# Patient Record
Sex: Male | Born: 1935 | Race: Black or African American | Hispanic: No | Marital: Single | Smoking: Former smoker
Health system: Southern US, Community
[De-identification: ages and names within clinical notes are randomized; demographics above are authoritative.]

## PROBLEM LIST (undated history)

## (undated) DIAGNOSIS — I10 Essential (primary) hypertension: Secondary | ICD-10-CM

## (undated) DIAGNOSIS — N400 Enlarged prostate without lower urinary tract symptoms: Secondary | ICD-10-CM

## (undated) DIAGNOSIS — I639 Cerebral infarction, unspecified: Secondary | ICD-10-CM

## (undated) DIAGNOSIS — C801 Malignant (primary) neoplasm, unspecified: Secondary | ICD-10-CM

---

## 2010-10-27 ENCOUNTER — Emergency Department (HOSPITAL_COMMUNITY): Payer: Medicare Other

## 2010-10-27 ENCOUNTER — Emergency Department (HOSPITAL_COMMUNITY)
Admission: EM | Admit: 2010-10-27 | Discharge: 2010-10-27 | Disposition: A | Discharge status: Home / Self Care | Payer: Medicare Other | Attending: Emergency Medicine | Admitting: Emergency Medicine

## 2010-10-27 DIAGNOSIS — S7000XA Contusion of unspecified hip, initial encounter: Secondary | ICD-10-CM | POA: Insufficient documentation

## 2010-10-27 DIAGNOSIS — S20219A Contusion of unspecified front wall of thorax, initial encounter: Secondary | ICD-10-CM | POA: Insufficient documentation

## 2010-10-27 DIAGNOSIS — Z7901 Long term (current) use of anticoagulants: Secondary | ICD-10-CM | POA: Insufficient documentation

## 2010-10-27 DIAGNOSIS — S40019A Contusion of unspecified shoulder, initial encounter: Secondary | ICD-10-CM | POA: Insufficient documentation

## 2010-10-27 DIAGNOSIS — I10 Essential (primary) hypertension: Secondary | ICD-10-CM | POA: Insufficient documentation

## 2010-10-27 DIAGNOSIS — W1789XA Other fall from one level to another, initial encounter: Secondary | ICD-10-CM | POA: Insufficient documentation

## 2010-10-27 DIAGNOSIS — Z79899 Other long term (current) drug therapy: Secondary | ICD-10-CM | POA: Insufficient documentation

## 2018-04-18 ENCOUNTER — Ambulatory Visit (INDEPENDENT_AMBULATORY_CARE_PROVIDER_SITE_OTHER): Payer: Medicare Other | Admitting: Urology

## 2018-04-18 DIAGNOSIS — C61 Malignant neoplasm of prostate: Secondary | ICD-10-CM

## 2018-04-18 DIAGNOSIS — N400 Enlarged prostate without lower urinary tract symptoms: Secondary | ICD-10-CM | POA: Diagnosis not present

## 2018-04-18 DIAGNOSIS — R310 Gross hematuria: Secondary | ICD-10-CM | POA: Diagnosis not present

## 2018-05-09 ENCOUNTER — Ambulatory Visit (INDEPENDENT_AMBULATORY_CARE_PROVIDER_SITE_OTHER): Payer: Medicare Other | Admitting: Urology

## 2018-05-09 DIAGNOSIS — R310 Gross hematuria: Secondary | ICD-10-CM | POA: Diagnosis not present

## 2018-05-09 DIAGNOSIS — C61 Malignant neoplasm of prostate: Secondary | ICD-10-CM | POA: Diagnosis not present

## 2018-05-10 ENCOUNTER — Other Ambulatory Visit: Payer: Self-pay | Admitting: Urology

## 2018-05-10 DIAGNOSIS — R310 Gross hematuria: Secondary | ICD-10-CM

## 2018-05-10 DIAGNOSIS — C61 Malignant neoplasm of prostate: Secondary | ICD-10-CM

## 2018-05-26 ENCOUNTER — Other Ambulatory Visit: Payer: Self-pay | Admitting: Urology

## 2018-05-26 DIAGNOSIS — C61 Malignant neoplasm of prostate: Secondary | ICD-10-CM

## 2018-06-05 ENCOUNTER — Emergency Department (HOSPITAL_COMMUNITY)
Admission: EM | Admit: 2018-06-05 | Discharge: 2018-06-05 | Disposition: A | Discharge status: Home / Self Care | Payer: Medicare Other | Attending: Emergency Medicine | Admitting: Emergency Medicine

## 2018-06-05 ENCOUNTER — Emergency Department (HOSPITAL_COMMUNITY): Payer: Medicare Other

## 2018-06-05 ENCOUNTER — Encounter (HOSPITAL_COMMUNITY): Payer: Self-pay | Admitting: Emergency Medicine

## 2018-06-05 ENCOUNTER — Other Ambulatory Visit: Payer: Self-pay

## 2018-06-05 DIAGNOSIS — I10 Essential (primary) hypertension: Secondary | ICD-10-CM | POA: Diagnosis not present

## 2018-06-05 DIAGNOSIS — Z79899 Other long term (current) drug therapy: Secondary | ICD-10-CM | POA: Diagnosis not present

## 2018-06-05 DIAGNOSIS — N201 Calculus of ureter: Secondary | ICD-10-CM

## 2018-06-05 DIAGNOSIS — M545 Low back pain: Secondary | ICD-10-CM | POA: Insufficient documentation

## 2018-06-05 DIAGNOSIS — G20 Parkinson's disease: Secondary | ICD-10-CM | POA: Diagnosis not present

## 2018-06-05 DIAGNOSIS — R1084 Generalized abdominal pain: Secondary | ICD-10-CM | POA: Diagnosis present

## 2018-06-05 DIAGNOSIS — R310 Gross hematuria: Secondary | ICD-10-CM

## 2018-06-05 DIAGNOSIS — C61 Malignant neoplasm of prostate: Secondary | ICD-10-CM

## 2018-06-05 DIAGNOSIS — W19XXXA Unspecified fall, initial encounter: Secondary | ICD-10-CM

## 2018-06-05 HISTORY — DX: Benign prostatic hyperplasia without lower urinary tract symptoms: N40.0

## 2018-06-05 HISTORY — DX: Malignant (primary) neoplasm, unspecified: C80.1

## 2018-06-05 HISTORY — DX: Cerebral infarction, unspecified: I63.9

## 2018-06-05 HISTORY — DX: Essential (primary) hypertension: I10

## 2018-06-05 LAB — CBC
HCT: 35 % — ABNORMAL LOW (ref 39.0–52.0)
HEMOGLOBIN: 10.8 g/dL — AB (ref 13.0–17.0)
MCH: 28.3 pg (ref 26.0–34.0)
MCHC: 30.9 g/dL (ref 30.0–36.0)
MCV: 91.6 fL (ref 80.0–100.0)
PLATELETS: 228 10*3/uL (ref 150–400)
RBC: 3.82 MIL/uL — AB (ref 4.22–5.81)
RDW: 14.5 % (ref 11.5–15.5)
WBC: 4 10*3/uL (ref 4.0–10.5)
nRBC: 0 % (ref 0.0–0.2)

## 2018-06-05 LAB — BASIC METABOLIC PANEL
Anion gap: 6 (ref 5–15)
BUN: 18 mg/dL (ref 8–23)
CHLORIDE: 103 mmol/L (ref 98–111)
CO2: 25 mmol/L (ref 22–32)
CREATININE: 1.31 mg/dL — AB (ref 0.61–1.24)
Calcium: 8.9 mg/dL (ref 8.9–10.3)
GFR, EST AFRICAN AMERICAN: 59 mL/min — AB (ref >60)
GFR, EST NON AFRICAN AMERICAN: 51 mL/min — AB (ref >60)
Glucose, Bld: 100 mg/dL — ABNORMAL HIGH (ref 70–99)
POTASSIUM: 3.4 mmol/L — AB (ref 3.5–5.1)
Sodium: 134 mmol/L — ABNORMAL LOW (ref 135–145)

## 2018-06-05 MED ORDER — IOPAMIDOL (ISOVUE-300) INJECTION 61%
100.0000 mL | Freq: Once | INTRAVENOUS | Status: AC | PRN
  Administered 2018-06-05: 100 mL via INTRAVENOUS

## 2018-06-05 MED ORDER — MORPHINE SULFATE (PF) 2 MG/ML IV SOLN
2.0000 mg | Freq: Once | INTRAVENOUS | Status: AC
Start: 2018-06-05 — End: 2018-06-05
  Administered 2018-06-05: 2 mg via INTRAVENOUS
  Filled 2018-06-05: qty 1

## 2018-06-05 NOTE — ED Provider Notes (Signed)
[facility] EMERGENCY DEPARTMENT Provider Note   CSN: [id number] Arrival date & time: 06/05/18  1603     History   Chief Complaint Chief Complaint  Patient presents with  . Fall    HPI Yao Hyppolite is a 82 y.o. male.  HPI Pt states his feet got caught this morning when he attempted to walk.Marland Kitchen   He ended up falling on the floor.  Patient has a history of Parkinson's and difficulty with his gait . pt is now having pain in his waist area and his lower back .  Patient states the pain in his lower abdomen has become severe.  He feels like he is bleeding internally.  No headache.  No cp or shob.  Patient denies any pain in his extremities.  Patient is scheduled for a nuclear medicine bone scan of his body tomorrow. Past Medical History:  Diagnosis Date  . Cancer [facility])    prostate  . Enlarged prostate   . Hypertension   . Stroke [facility])    TIA    There are no active problems to display for this patient.   History reviewed. No pertinent surgical history.      Home Medications    Prior to Admission medications   Medication Sig Start Date End Date Taking? Authorizing Provider  Cholecalciferol (VITAMIN D3) 25 MCG (1000 UT) CHEW Chew 1 tablet by mouth daily.   Yes [provider]  clotrimazole-betamethasone (LOTRISONE) cream Apply 1 application topically daily as needed. 04/15/18  Yes [provider]  docusate sodium (COLACE) 100 MG capsule Take 100 mg by mouth daily as needed for mild constipation.   Yes [provider]  finasteride (PROSCAR) 5 MG tablet Take 5 mg by mouth daily. 04/18/18  Yes [provider]  furosemide (LASIX) 20 MG tablet Take 20 mg by mouth.   Yes [provider]  levothyroxine (SYNTHROID, LEVOTHROID) 137 MCG tablet Take 137 mcg by mouth daily before breakfast.   Yes [provider]  losartan-hydrochlorothiazide (HYZAAR) 50-12.5 MG tablet Take 1 tablet by mouth daily.   Yes [provider]    metoprolol succinate (TOPROL-XL) 25 MG 24 hr tablet Take 25 mg by mouth daily.   Yes [provider]  Multiple Vitamins-Minerals (CENTRUM SILVER) CHEW Chew 1 tablet by mouth daily.   Yes [provider]  traMADol (ULTRAM) 50 MG tablet Take 50 mg by mouth 2 (two) times daily as needed. Take 1 tablet by mouth twice daily as needed for severe pain. 03/11/18  Yes [provider]  warfarin (COUMADIN) 2.5 MG tablet Take 2.5 mg by mouth daily.   Yes [provider]    Family History No family history on file.  Social History Social History   Tobacco Use  . Smoking status: Former Research scientist (life sciences)  . Smokeless tobacco: Former Network engineer Use Topics  . Alcohol use: Not Currently  . Drug use: Not Currently     Allergies   Patient has no known allergies.   Review of Systems Review of Systems  All other systems reviewed and are negative.    Physical Exam Updated Vital Signs BP (!) 152/88   Pulse 96   Temp 99.1 F (37.3 C) (Oral)   Resp 20   Ht 1.854 m (6\' 1" )   Wt 90.7 kg   SpO2 96%   BMI 26.39 kg/m   Physical Exam Vitals signs and nursing note reviewed.  Constitutional:      General: He is not in acute  distress.    Appearance: He is well-developed.  HENT:     Head: Normocephalic and atraumatic.     Right Ear: External ear normal.     Left Ear: External ear normal.  Eyes:     General: No scleral icterus.       Right eye: No discharge.        Left eye: No discharge.     Conjunctiva/sclera: Conjunctivae normal.  Neck:     Musculoskeletal: Neck supple.     Trachea: No tracheal deviation.  Cardiovascular:     Rate and Rhythm: Normal rate and regular rhythm.  Pulmonary:     Effort: Pulmonary effort is normal. No respiratory distress.     Breath sounds: Normal breath sounds. No stridor. No wheezing or rales.  Abdominal:     General: Bowel sounds are normal. There is no distension.     Palpations: Abdomen is soft. There is no pulsatile  mass.     Tenderness: There is abdominal tenderness in the suprapubic area. There is no guarding or rebound.  Musculoskeletal:     Right hip: Normal.     Left hip: Normal.     Cervical back: Normal.     Thoracic back: Normal.     Lumbar back: He exhibits tenderness and bony tenderness.  Skin:    General: Skin is warm and dry.     Findings: No rash.  Neurological:     General: No focal deficit present.     Mental Status: He is alert.     Cranial Nerves: No cranial nerve deficit (no facial droop, extraocular movements intact, no slurred speech).     Sensory: No sensory deficit.     Motor: No abnormal muscle tone or seizure activity.     Coordination: Coordination abnormal. Impaired rapid alternating movements.     Comments: Movements are slow      ED Treatments / Results  Labs (all labs ordered are listed, but only abnormal results are displayed) Labs Reviewed  CBC - Abnormal; Notable for the following components:      Result Value   RBC 3.82 (*)    Hemoglobin 10.8 (*)    HCT 35.0 (*)    All other components within normal limits  BASIC METABOLIC PANEL - Abnormal; Notable for the following components:   Sodium 134 (*)    Potassium 3.4 (*)    Glucose, Bld 100 (*)    Creatinine, Ser 1.31 (*)    GFR calc non Af Amer 51 (*)    GFR calc Af Amer 59 (*)    All other components within normal limits    EKG None  Radiology Dg Lumbar Spine Complete  Result Date: 06/05/2018 CLINICAL DATA:  Low back pain after falling today EXAM: LUMBAR SPINE - COMPLETE 4+ VIEW COMPARISON:  None FINDINGS: Osseous demineralization. Five non-rib-bearing lumbar vertebra. Superior endplate compression fractures are identified at L1 and L3, with approximately 33% height loss at L1 and 20% height loss at L3, both age indeterminate in character. No subluxation or bone destruction. Multilevel mild disc space narrowing. SI joints preserved. Facet degenerative changes lower lumbar spine and at lumbosacral  junction. IMPRESSION: Age-indeterminate superior endplate compression fractures of L1 and L3. Scattered degenerative disc and facet disease changes of the lumbar spine with osseous demineralization. Electronically Signed   By: Lavonia Dana M.D.   On: 06/05/2018 17:26   Ct Abdomen Pelvis W Contrast  Result Date: 06/05/2018 CLINICAL DATA:  Low back pain following a  fall this morning. EXAM: CT ABDOMEN AND PELVIS WITH CONTRAST TECHNIQUE: Multidetector CT imaging of the abdomen and pelvis was performed using the standard protocol following bolus administration of intravenous contrast. CONTRAST:  176mL ISOVUE-300 IOPAMIDOL (ISOVUE-300) INJECTION 61% COMPARISON:  Lumbar spine radiographs obtained earlier today. FINDINGS: Lower chest: Enlarged heart with marked biatrial enlargement, most pronounced involving the left atrium. Hepatobiliary: Poorly delineated due to streak artifacts. Multiple small liver cysts. The gallbladder is not at well visualized. Pancreas: No gross abnormality. Spleen: Grossly normal. Adrenals/Urinary Tract: Normal appearing adrenal glands. Large number of cysts in both kidneys. These include a 6.1 cm mid left renal cyst with minimally thickened wall calcifications. No visible soft tissue component. Multiple small calculi in the mid and lower left kidney, measuring up to 4 mm in maximum diameter each. Moderate dilatation of the left renal collecting system to the level of a 7 mm calculus in the proximal ureter to just distal to the ureteropelvic junction. No additional ureteral calculi are seen. Normal appearing urinary bladder. Stomach/Bowel: Multiple sigmoid and descending colon diverticula without evidence diverticulitis. Normal appearing stomach, small bowel and appendix. Vascular/Lymphatic: Atheromatous arterial calcifications without aneurysm. No enlarged lymph nodes. Reproductive: Mildly to moderately enlarged prostate gland containing coarse calcifications. Moderate enlargement of the  seminal vesicles. Other: Small umbilical hernia containing fat. There is edema within the herniated fat as well as diffuse subcutaneous edema. Musculoskeletal: Lumbar and lower thoracic spine degenerative changes. Approximately 25% L1 and L3 vertebral superior endplate compression deformities no acute fracture lines seen. No bony retropulsion at the L1 level. There is mild bony retropulsion and spur formation at the L3 level. Patchy sclerotic foci are demonstrated in the S1 and S2 vertebrae with small sclerotic foci in the sacral ala bilateral and left iliac bone. IMPRESSION: 1. 7 mm proximal left ureteral calculus causing moderate left hydronephrosis. 2. Multiple small, nonobstructing left renal calculi. 3. Large number of cysts in both kidneys, including a 6.1 cm mid left renal cyst with minimally thickened wall calcifications. This is a Bosniak category 2 cyst. 4. Colonic diverticulosis. 5. Mildly to moderately enlarged prostate gland. 6. Small umbilical hernia containing fat. 7. Patchy sclerotic foci in the sacrum and left iliac bone. These are concerning for possible prostate cancer metastases Electronically Signed   By: Claudie Revering M.D.   On: 06/05/2018 20:22    Procedures Procedures (including critical care time)  Medications Ordered in ED Medications  morphine 2 MG/ML injection 2 mg (2 mg Intravenous Given 06/05/18 1657)  iopamidol (ISOVUE-300) 61 % injection 100 mL (100 mLs Intravenous Contrast Given 06/05/18 1928)     Initial Impression / Assessment and Plan / ED Course  I have reviewed the triage vital signs and the nursing notes.  Pertinent labs & imaging results that were available during my care of the patient were reviewed by me and considered in my medical decision making (see chart for details).   Patient's hemoglobin and creatinine are at his baseline.  Patient had old labs with him to compare.  X-ray findings show the possibility of lumbar compression fractures.  I suspect these  are most likely chronic as the patient does not have any discrete bony tenderness in those areas.  It is possibly could be related to his fall but he is not having any severe discomfort and this can be treated conservatively.  CT scan does show evidence of ureteral stone and hydronephrosis.  There is also possible sclerosis in the left iliac bone.  Patient is seeing Dr.  Dahlstedt, urology for hematuria.  He is scheduled for a bone scan tomorrow.  Patient is already in the process of getting these findings evaluated.  He is stable for outpatient discharge and treatment    Final Clinical Impressions(s) / ED Diagnoses   Final diagnoses:  Fall, initial encounter  Ureteral stone    ED Discharge Orders    None       Dorie Rank, MD 06/05/18 2042

## 2018-06-05 NOTE — Discharge Instructions (Addendum)
Follow-up tomorrow for the bone scan as planned, follow-up with Dr. Diona Fanti regarding the ureteral stone, take over the counter medications to help with your pain, return as needed for worsening symptoms

## 2018-06-05 NOTE — ED Triage Notes (Signed)
Pt with hx of Parkinson's had a fall around 1130 this morning. Pt lost his balance and fell. Pt c/o lower back pain. Pt denies hitting head or LOC.

## 2018-06-06 ENCOUNTER — Encounter (HOSPITAL_COMMUNITY)
Admission: RE | Admit: 2018-06-06 | Discharge: 2018-06-06 | Disposition: A | Discharge status: Home / Self Care | Payer: Medicare Other | Source: Ambulatory Visit | Attending: Urology | Admitting: Urology

## 2018-06-06 ENCOUNTER — Ambulatory Visit (HOSPITAL_COMMUNITY)
Admission: RE | Admit: 2018-06-06 | Discharge: 2018-06-06 | Disposition: A | Discharge status: Home / Self Care | Payer: Medicare Other | Source: Ambulatory Visit | Attending: Urology | Admitting: Urology

## 2018-06-06 ENCOUNTER — Encounter (HOSPITAL_COMMUNITY): Payer: Self-pay

## 2018-06-06 DIAGNOSIS — C61 Malignant neoplasm of prostate: Secondary | ICD-10-CM

## 2018-06-06 MED ORDER — TECHNETIUM TC 99M MEDRONATE IV KIT
20.0000 | PACK | Freq: Once | INTRAVENOUS | Status: AC | PRN
  Administered 2018-06-06: 21 via INTRAVENOUS

## 2018-07-25 ENCOUNTER — Ambulatory Visit: Payer: Medicare Other | Admitting: Urology

## 2019-10-27 IMAGING — CT CT ABD-PELV W/ CM
2 of 6 series · 15 of 46 positions shown, 17 images · IV contrast (Isovue)
Comparison: Lumbar spine radiographs obtained earlier today.

CLINICAL DATA: Low back pain following a fall this morning.

EXAM:
CT ABDOMEN AND PELVIS WITH CONTRAST
TECHNIQUE: Multidetector CT imaging of the abdomen and pelvis was performed
using the standard protocol following bolus administration of
intravenous contrast.
CONTRAST:  100mL D2KPZ5-499 IOPAMIDOL (D2KPZ5-499) INJECTION 61%

[Series 3: axial st · axial · 0.70mm/px · z∈[-404,-14]mm · 12 of 92 slices shown, 14 images]
[im 7/92  soft-tissue]
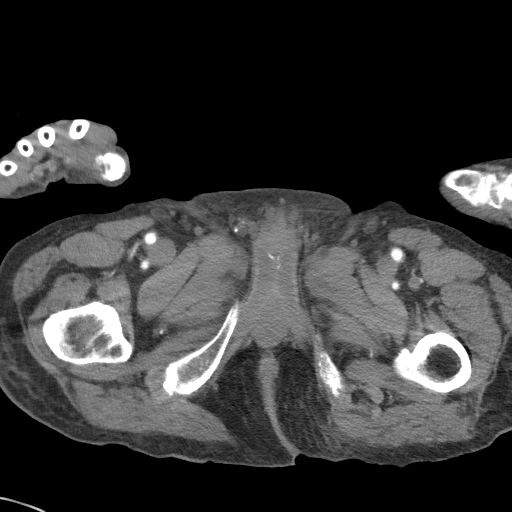
[im 7/92  bone]
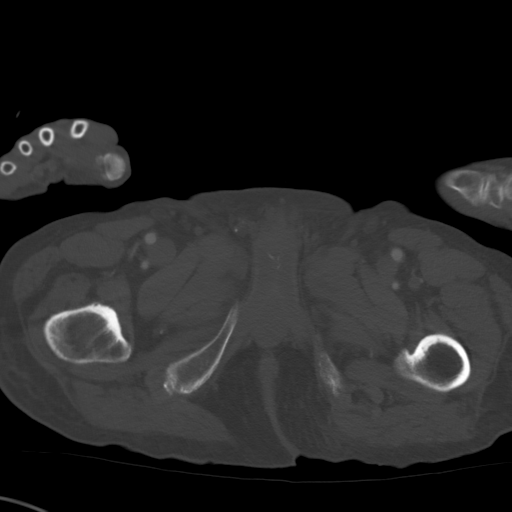
[im 13/92  soft-tissue]
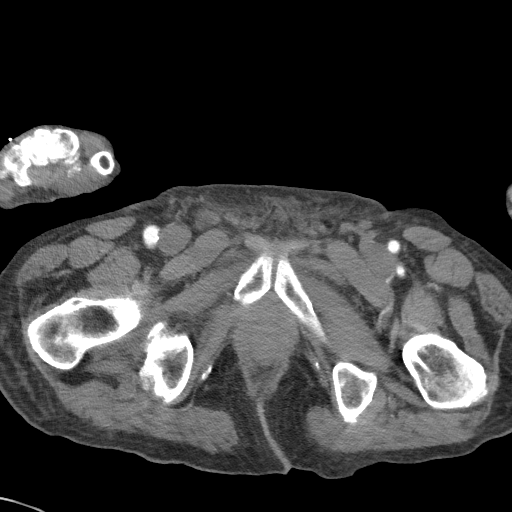
[im 19/92  soft-tissue]
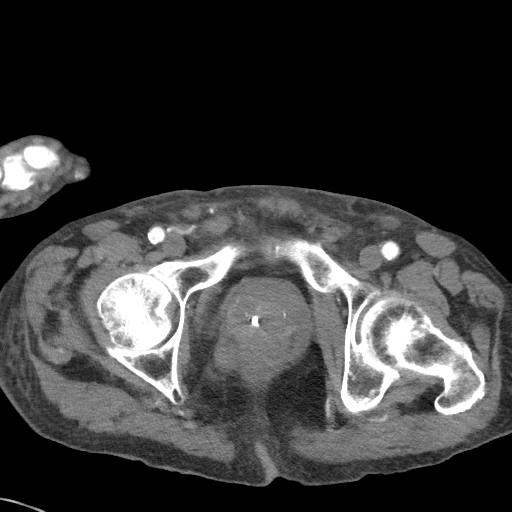
[im 31/92  soft-tissue]
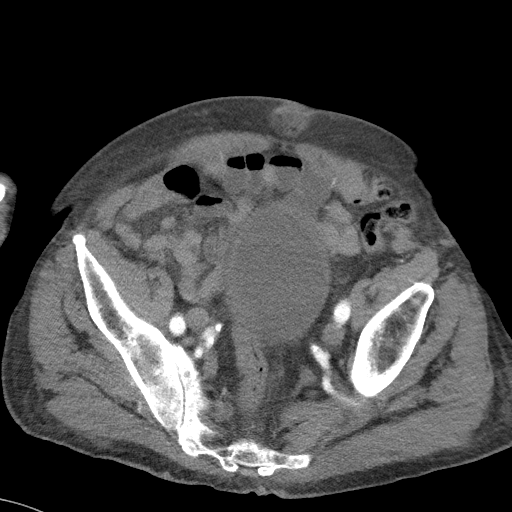
[im 37/92  soft-tissue]
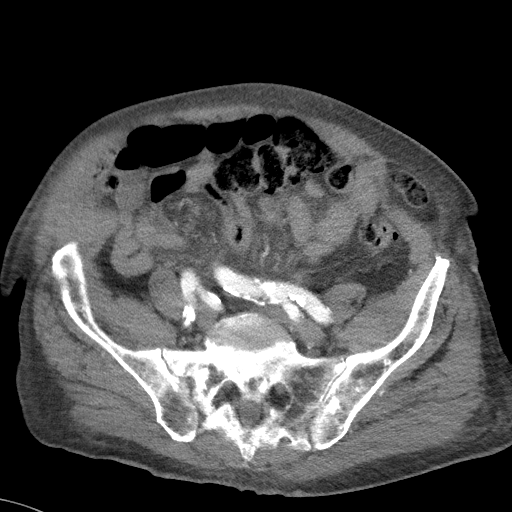
[im 43/92  soft-tissue]
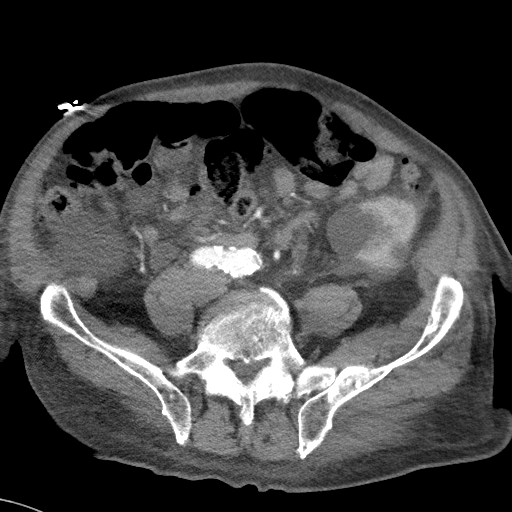
[im 49/92  soft-tissue]
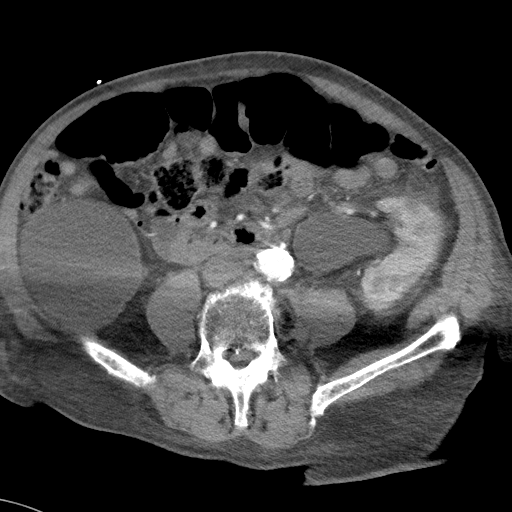
[im 55/92  soft-tissue]
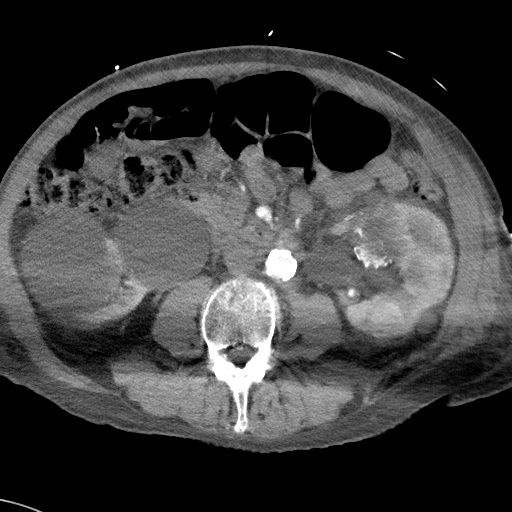
[im 61/92  soft-tissue]
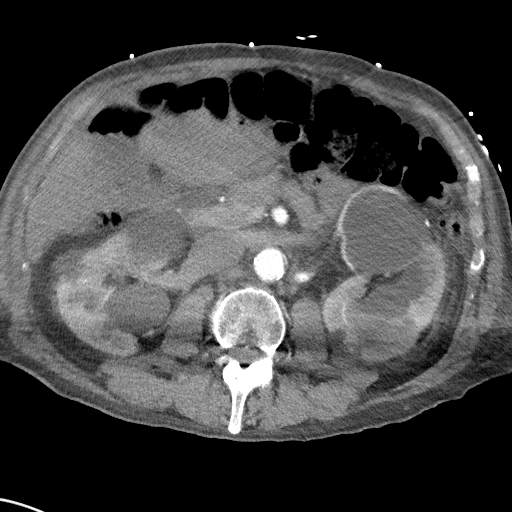
[im 61/92  bone]
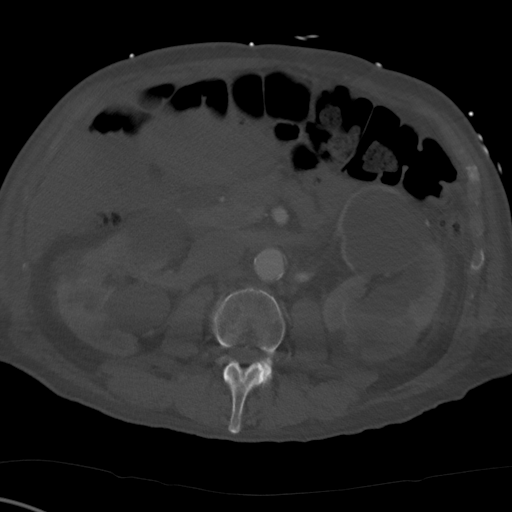
[im 73/92  soft-tissue]
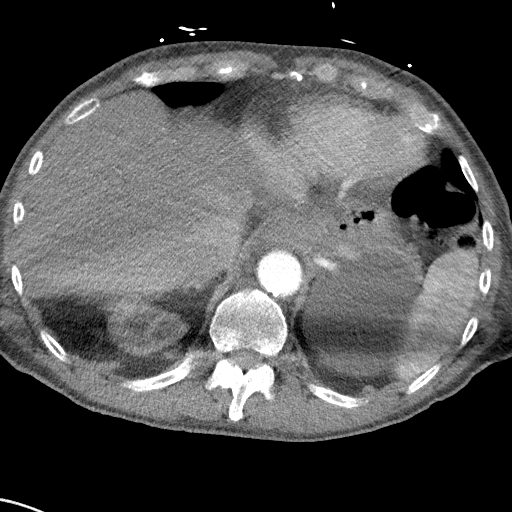
[im 79/92  soft-tissue]
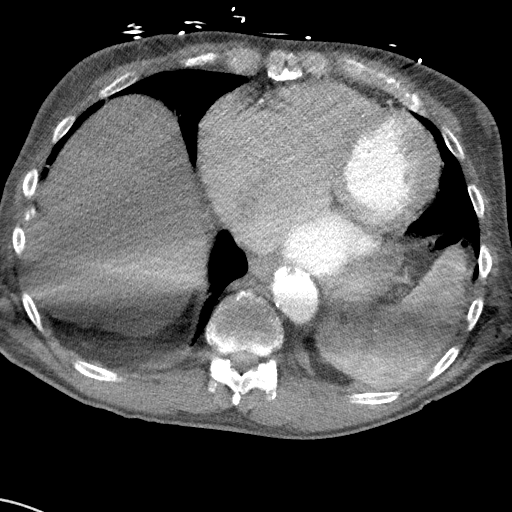
[im 85/92  soft-tissue]
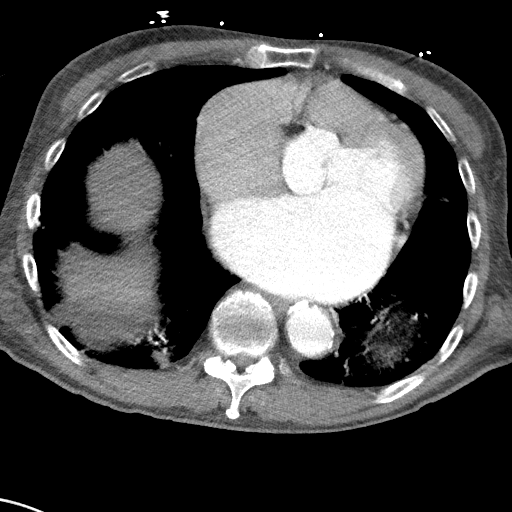

[Series 6: coronal st · coronal · 0.74mm/px · 3 of 100 slices shown]
[im 34/100  soft-tissue]
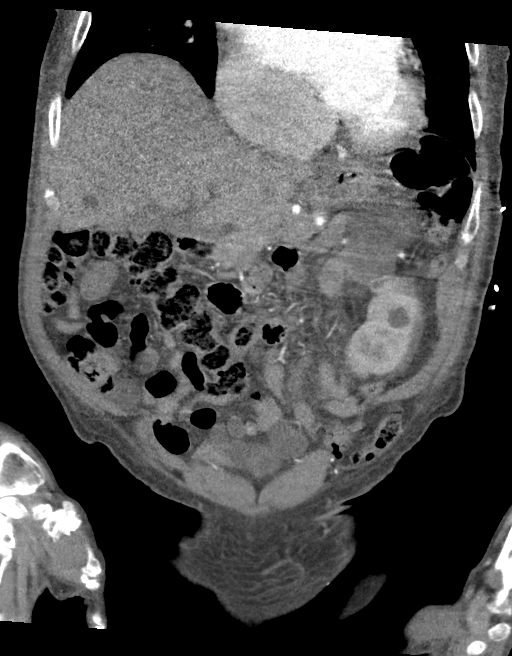
[im 45/100  soft-tissue]
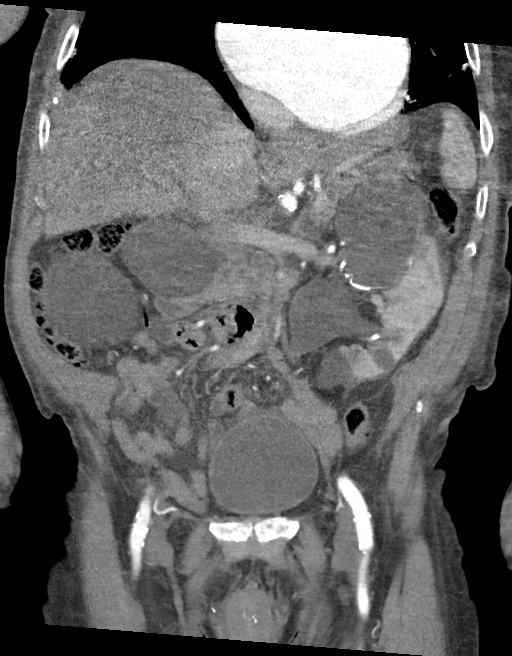
[im 56/100  soft-tissue]
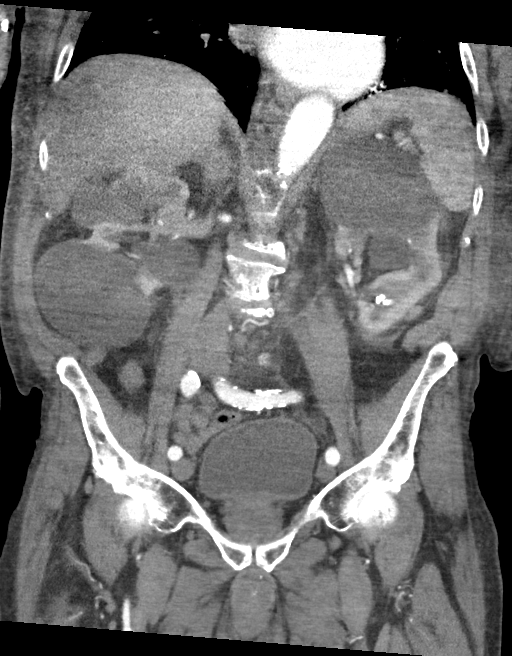

[15 of 46 positions shown; findings below may reference images not displayed]

FINDINGS: Lower chest: Enlarged heart with marked biatrial enlargement, most
pronounced involving the left atrium.

Hepatobiliary: Poorly delineated due to streak artifacts. Multiple
small liver cysts. The gallbladder is not at well visualized.

Pancreas: No gross abnormality.

Spleen: Grossly normal.

Adrenals/Urinary Tract: Normal appearing adrenal glands. Large
number of cysts in both kidneys. These include a 6.1 cm mid left
renal cyst with minimally thickened wall calcifications. No visible
soft tissue component.

Multiple small calculi in the mid and lower left kidney, measuring
up to 4 mm in maximum diameter each. Moderate dilatation of the left
renal collecting system to the level of a 7 mm calculus in the
proximal ureter to just distal to the ureteropelvic junction. No
additional ureteral calculi are seen. Normal appearing urinary
bladder.

Stomach/Bowel: Multiple sigmoid and descending colon diverticula
without evidence diverticulitis. Normal appearing stomach, small
bowel and appendix.

Vascular/Lymphatic: Atheromatous arterial calcifications without
aneurysm. No enlarged lymph nodes.

Reproductive: Mildly to moderately enlarged prostate gland
containing coarse calcifications. Moderate enlargement of the
seminal vesicles.

Other: Small umbilical hernia containing fat. There is edema within
the herniated fat as well as diffuse subcutaneous edema.

Musculoskeletal: Lumbar and lower thoracic spine degenerative
changes. Approximately 25% L1 and L3 vertebral superior endplate
compression deformities no acute fracture lines seen. No bony
retropulsion at the L1 level. There is mild bony retropulsion and
spur formation at the L3 level.

Patchy sclerotic foci are demonstrated in the S1 and S2 vertebrae
with small sclerotic foci in the sacral ala bilateral and left iliac
bone.
IMPRESSION: 1. 7 mm proximal left ureteral calculus causing moderate left
hydronephrosis.
2. Multiple small, nonobstructing left renal calculi.
3. Large number of cysts in both kidneys, including a 6.1 cm mid
left renal cyst with minimally thickened wall calcifications. This
is a Bosniak category 2 cyst.
4. Colonic diverticulosis.
5. Mildly to moderately enlarged prostate gland.
6. Small umbilical hernia containing fat.
7. Patchy sclerotic foci in the sacrum and left iliac bone. These
are concerning for possible prostate cancer metastases

## 2019-10-28 IMAGING — NM NM BONE WHOLE BODY
2 series · 2 of 2 positions shown · non-contrast
Comparison: 06/05/2018 CT abdomen and pelvis.

CLINICAL DATA: 82-year-old male with prostate cancer. Fell
yesterday. Subsequent encounter.

EXAM:
NUCLEAR MEDICINE WHOLE BODY BONE SCAN
TECHNIQUE: Whole body anterior and posterior images were obtained approximately
3 hours after intravenous injection of radiopharmaceutical.
RADIOPHARMACEUTICALS:  21.0 mCi 0echnetium-99m MDP IV

[Series 1: whole body · 2.66mm/px · 1 of 1 slices shown (1 of 2)]
[im 1/1]
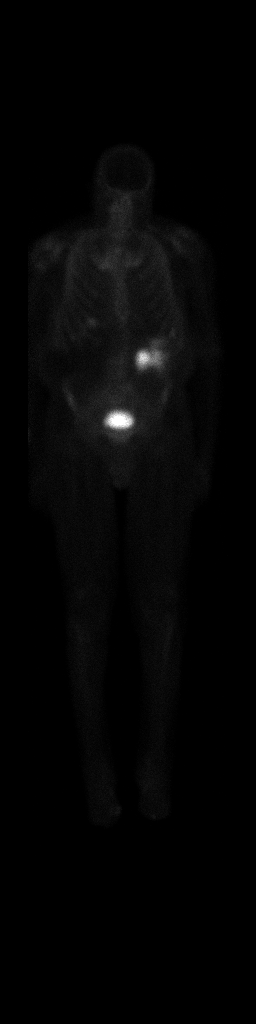

[Series 1: whole body · 2.66mm/px · 1 of 1 slices shown (2 of 2)]
[im 1/1]
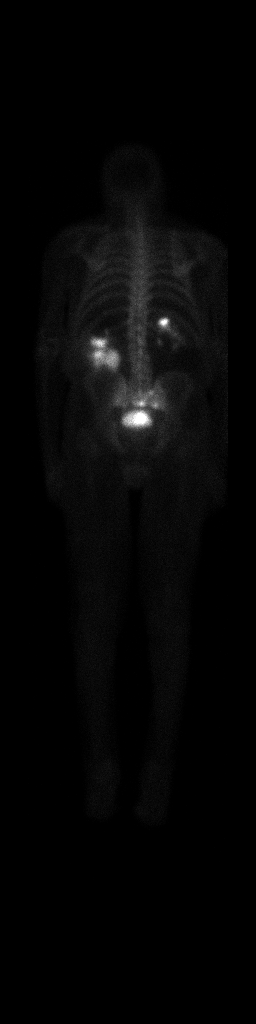

[2 of 2 positions shown; findings below may reference images not displayed]

FINDINGS: Radiotracer uptake of the sacrum and medial aspect ilium bilaterally
consistent with osseous metastatic disease as seen on recent CT.

On frontal view, radiotracer uptake anterior lower ribs bilaterally.

Asymmetric uptake radiotracer within the kidneys in this patient has
multiple large renal cysts.
IMPRESSION: 1. Radiotracer uptake in the sacrum and medial aspect of ilium
bilaterally consistent with osseous metastatic disease as seen on
recent CT.
2. On frontal view, radiotracer uptake anterior lower ribs
bilaterally. No accompanying sclerotic focus seen on recent CT. It
is possible this is related to result of patient's recent fall.

## 2020-07-21 ENCOUNTER — Encounter (HOSPITAL_COMMUNITY): Payer: Self-pay | Admitting: Physical Therapy

## 2020-07-21 ENCOUNTER — Ambulatory Visit (HOSPITAL_COMMUNITY): Payer: Medicare Other | Attending: Internal Medicine | Admitting: Physical Therapy

## 2020-07-21 ENCOUNTER — Other Ambulatory Visit: Payer: Self-pay

## 2020-07-21 DIAGNOSIS — Z9181 History of falling: Secondary | ICD-10-CM | POA: Diagnosis present

## 2020-07-21 DIAGNOSIS — R2689 Other abnormalities of gait and mobility: Secondary | ICD-10-CM | POA: Diagnosis present

## 2020-07-21 DIAGNOSIS — R262 Difficulty in walking, not elsewhere classified: Secondary | ICD-10-CM | POA: Insufficient documentation

## 2020-07-21 NOTE — Therapy (Signed)
[facility] [address] [place], [place], [address] Phone: [phone]   Fax:  [phone]  Physical Therapy Evaluation  Patient Details  Name: [place] MRN: [id number] Date of Birth: July 10, 1935 Referring Provider (PT): Corrie Mckusick MD   Encounter Date: 07/21/2020   PT End of Session - 07/21/20 1618    Visit Number 1    Number of Visits 18    Date for PT Re-Evaluation 09/02/20    Authorization Type BCBS no VL or auth    Progress Note Due on Visit 10    PT Start Time 1620   pt late to session   PT Stop Time 1657    PT Time Calculation (min) 37 min    Equipment Utilized During Treatment Gait belt    Activity Tolerance Patient limited by fatigue    Behavior During Therapy [facility] for tasks assessed/performed           Past Medical History:  Diagnosis Date  . Cancer [facility])    prostate  . Enlarged prostate   . Hypertension   . Stroke [facility])    TIA    History reviewed. No pertinent surgical history.  There were no vitals filed for this visit.    Subjective Assessment - 07/21/20 1631    Subjective Patient poor historian. States that about 1-2 years ago he fell and broke his hip. States that he has having difficulty walking since. States he can walk with a rollator. States he wants to be put on exercise machines states he wants to be able to walk. States he wants to be stretched out. States that he hasn't fallen in the last 6 months. States that he uses a bedside commode. States that getting in and out of the bed is challenging and he needs help. 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 States he would like to walk with cane instead of walker.    Pertinent History parkinson's    Currently in Pain? No/denies              [facility] PT Assessment - 07/22/20 0001      Assessment   Medical Diagnosis parkinsonism    Referring Provider (PT) Corrie Mckusick MD      Balance Screen   Has the patient fallen in the  past 6 months No   per patient report     Home Environment   Living Environment Private residence    Living Arrangements Spouse/significant other    Available Help at Discharge Family    Type of Home House    Home Access Level entry;Ramped entrance    Home Layout One level    Home Equipment Walker - 2 wheels;Cane - single point;Crutches;Wheelchair - manual;Grab bars - tub/shower;Grab bars - toilet;Bedside commode      Prior Function   Level of Independence Needs assistance with ADLs;Needs assistance with gait;Needs assistance with transfers;Needs assistance with homemaking    Level of Independence - Bath --   aid comes to assist     Cognition   Overall Cognitive Status Within Functional Limits for tasks assessed      Observation/Other Assessments   Observations pitting edema 4+ bilaterally with stretched out knee high compression socks on that have been cut at the tops    Focus on Therapeutic Outcomes (FOTO)  NA      Transfers   Transfers Sit to Stand;Stand to Sit;Stand Pivot Transfers    Sit to Stand 3: Mod assist;2:  Max assist   verbal cues, patient at home is assisted with 2 people pulling on his arms   Stand to Sit 3: Mod assist;2: Max assist   verbal cues     Ambulation/Gait   Ambulation/Gait Yes    Ambulation/Gait Assistance 4: Min guard    Ambulation Distance (Feet) 60 Feet    Assistive device --   rollator   Gait Pattern Decreased hip/knee flexion - right;Decreased hip/knee flexion - left;Decreased stride length;Step-through pattern;Decreased dorsiflexion - right;Decreased dorsiflexion - left;Shuffle;Festinating;Trunk flexed   pushes rollator way out in front of him   Ambulation Surface Level;Indoor    Gait velocity decreased    Gait velocity - backwards very very slow, difficulty to perform when backing up into chair      Balance   Balance Assessed Yes      Standardized Balance Assessment   Standardized Balance Assessment Berg Balance Test    Balance Master Testing  Other/comments      Berg Balance Test   Sit to Stand Needs moderate or maximal assist to stand    Standing Unsupported Unable to stand 30 seconds unassisted    Sitting with Back Unsupported but Feet Supported on Floor or Stool Able to sit 10 seconds    Stand to Sit Needs assistance to sit    Transfers Needs one person to assist    Standing Unsupported with Eyes Closed Needs help to keep from falling    Standing Unsupported with Feet Together Needs help to attain position and unable to hold for 15 seconds    From Standing, Reach Forward with Outstretched Arm Loses balance while trying/requires external support    From Standing Position, Pick up Object from Floor Unable to try/needs assist to keep balance    From Standing Position, Turn to Look Behind Over each Shoulder Needs assist to keep from losing balance and falling    Turn 360 Degrees Needs assistance while turning    Standing Unsupported, Alternately Place Feet on Step/Stool Needs assistance to keep from falling or unable to try    Standing Unsupported, One Foot in [employer] balance while stepping or standing    Standing on One Leg Unable to try or needs assist to prevent fall    Total Score 2      High Level Balance   High Level Balance Comments standing balance: unable to stand unassisted locks out knees and has weight shifted too far anteriorly or posteriorly despite cues - required moderate assist to maintain standing balance.                      Objective measurements completed on examination: See above findings.               PT Education - 07/22/20 0735    Education Details on presentation, on POC and focus of rehab    Person(s) Educated Patient    Methods Explanation    Comprehension Verbalized understanding            PT Short Term Goals - 07/22/20 0822      PT SHORT TERM GOAL #1   Title Patient will report at least 25% improvement in overall symptoms and/or function to demonstrate  improved functional mobility    Time 3    Period Weeks    Status New    Target Date 08/12/20      PT SHORT TERM GOAL #2   Title Patient will be able to stand at counter  with one upper extremity support and SBA to demonstrate improved standing balance    Time 3    Period Weeks    Status New    Target Date 08/12/20      PT SHORT TERM GOAL #3   Title Patient will be independent in self management strategies to improve quality of life and functional outcomes.    Time 3    Period Weeks    Status New    Target Date 08/12/20             PT Long Term Goals - 07/22/20 0823      PT LONG TERM GOAL #1   Title Patient will be able to transition from sit to stand with verbal cues and min to moderate assist to demosntrate improved transitional mobility    Time 6    Period Weeks    Status New    Target Date 09/02/20      PT LONG TERM GOAL #2   Title Patient will be able to stand with min assist without upper extremity support to demonstrate improved standing balance    Time 6    Period Weeks    Status New    Target Date 09/02/20      PT LONG TERM GOAL #3   Title Patient and wife will report that patient is regularly trying to stand up from lift chair instead of getting +2 support at home    Time 6    Period Weeks    Status New    Target Date 09/02/20                  Plan - 07/21/20 1618    Clinical Impression Statement Patient presents to therapy with complaints of weakness and desire to walk with cane. Upon evaluation, patient noted to demonstrate significant weakness that severely limits functional mobility. Patient unable to stand unassisted secondary to severe balance deficits. Pitting edema noted in bilateral lower extremities and educated patient in thigh high compression garments. Patient demonstrates high fall risk at this time as demonstrated by BERG balance test and would greatly benefit from skilled physical therapy to improve functional mobility and reduce  overall fall risk.    Personal Factors and Comorbidities Comorbidity 1    Comorbidities HTN, CVA, parkinson's    Examination-Activity Limitations Bed Mobility;Stand;Locomotion Level    Stability/Clinical Decision Making Evolving/Moderate complexity    Clinical Decision Making Moderate    Rehab Potential Fair    PT Frequency 3x / week    PT Duration 6 weeks    PT Treatment/Interventions ADLs/Self Care Home Management;Aquatic Therapy;Balance training;Therapeutic exercise;Therapeutic activities;Functional mobility training;Stair training;Gait training;Neuromuscular re-education;Patient/family education;Manual techniques;Dry needling;Passive range of motion    PT Next Visit Plan measure for compression garments, standing, 2MW, STS,    PT Home Exercise Plan standing at counter, STS from lift chair    Consulted and Agree with Plan of Care Patient;Family member/caregiver    Family Member Consulted Rod Holler - Wife           Patient will benefit from skilled therapeutic intervention in order to improve the following deficits and impairments:  Pain,Abnormal gait,Decreased endurance,Increased edema,Decreased knowledge of precautions,Decreased knowledge of use of DME,Decreased activity tolerance,Decreased balance,Decreased mobility,Difficulty walking,Decreased strength,Decreased range of motion,Postural dysfunction  Visit Diagnosis: Balance problem  History of falling  Difficulty in walking, not elsewhere classified     Problem List There are no problems to display for this patient.  9:33 AM, 07/22/20 Jerene Pitch, DPT Physical  Therapy with [facility]  [phone] office   [place] [address] [place], [place], [address] Phone: [phone]   Fax:  [phone]  Name: [place] MRN: [id number] Date of Birth: [birth year]

## 2020-07-22 ENCOUNTER — Telehealth (HOSPITAL_COMMUNITY): Payer: Self-pay | Admitting: Physical Therapy

## 2020-07-22 ENCOUNTER — Ambulatory Visit (HOSPITAL_COMMUNITY): Payer: BLUE CROSS/BLUE SHIELD | Admitting: Physical Therapy

## 2020-07-22 NOTE — Telephone Encounter (Signed)
pt cancelled appt for today, no reason given 

## 2020-07-24 ENCOUNTER — Ambulatory Visit (HOSPITAL_COMMUNITY): Payer: BLUE CROSS/BLUE SHIELD

## 2020-07-25 ENCOUNTER — Ambulatory Visit (HOSPITAL_COMMUNITY): Payer: Medicare Other | Attending: Internal Medicine

## 2020-07-25 ENCOUNTER — Encounter (HOSPITAL_COMMUNITY): Payer: Self-pay

## 2020-07-25 ENCOUNTER — Other Ambulatory Visit: Payer: Self-pay

## 2020-07-25 DIAGNOSIS — R2689 Other abnormalities of gait and mobility: Secondary | ICD-10-CM | POA: Insufficient documentation

## 2020-07-25 DIAGNOSIS — Z9181 History of falling: Secondary | ICD-10-CM | POA: Diagnosis present

## 2020-07-25 DIAGNOSIS — R262 Difficulty in walking, not elsewhere classified: Secondary | ICD-10-CM | POA: Insufficient documentation

## 2020-07-25 NOTE — Therapy (Signed)
[place], [place], [address] Phone: [phone]   Fax:  [phone]  Physical Therapy Treatment  Patient Details  Name: [place] MRN: [id number] Date of Birth: [birth year] Referring Provider (PT): Ralph Leyden MD   Encounter Date: 07/25/2020   PT End of Session - 07/25/20 1834    Visit Number 2    Number of Visits 18    Date for PT Re-Evaluation 09/02/20    Authorization Type BCBS no VL or auth    Progress Note Due on Visit 10    PT Start Time 1533    PT Stop Time 1620    PT Time Calculation (min) 47 min    Equipment Utilized During Treatment Gait belt    Activity Tolerance Patient limited by fatigue;Patient tolerated treatment well    Behavior During Therapy [facility] for tasks assessed/performed           Past Medical History:  Diagnosis Date  . Cancer [facility])    prostate  . Enlarged prostate   . Hypertension   . Stroke [facility])    TIA    History reviewed. No pertinent surgical history.  There were no vitals filed for this visit.   Subjective Assessment - 07/25/20 1540    Subjective Pt reports he rode stationary bike this morning for 10-15 minutes,  No reports of pain, feet feel very heavy today.    Pertinent History parkinson's    Currently in Pain? No/denies                             [facility] Adult PT Treatment/Exercise - 07/25/20 0001      Transfers   Transfers Sit to Stand    Sit to Stand 4: Min guard;4: Min assist   Environmental education officer for body mechanics to get into SUV for safety      Ambulation/Gait   Ambulation Distance (Feet) 76 Feet    Assistive device --   rollator   Gait Pattern Decreased hip/knee flexion - right;Decreased hip/knee flexion - left;Decreased stride length;Step-through pattern;Decreased dorsiflexion - right;Decreased dorsiflexion - left;Shuffle;Festinating;Trunk flexed    Ambulation Surface Level;Indoor    Gait velocity decreased     Gait Comments 2MWT      Exercises   Exercises Knee/Hip      Knee/Hip Exercises: Standing   Gait Training 2MWT 74ft with rollator    Other Standing Knee Exercises 2 sets standing x 1' each, intermittent UE Support      Knee/Hip Exercises: Seated   Other Seated Knee/Hip Exercises X to V UE Big reaching 10x (HEP    Sit to Sand 10 reps;with UE support   25in height, eccentric control., cueing for mechanics                 PT Education - 07/25/20 1544    Education Details Reviewed goals, educated importance of compression garment for edema control, measurements complete and educated butler for donning assistance.            PT Short Term Goals - 07/22/20 5462      PT SHORT TERM GOAL #1   Title Patient will report at least 25% improvement in overall symptoms and/or function to demonstrate improved functional mobility    Time 3    Period Weeks    Status New    Target Date 08/12/20      PT SHORT TERM  GOAL #2   Title Patient will be able to stand at counter with one upper extremity support and SBA to demonstrate improved standing balance    Time 3    Period Weeks    Status New    Target Date 08/12/20      PT SHORT TERM GOAL #3   Title Patient will be independent in self management strategies to improve quality of life and functional outcomes.    Time 3    Period Weeks    Status New    Target Date 08/12/20             PT Long Term Goals - 07/22/20 0823      PT LONG TERM GOAL #1   Title Patient will be able to transition from sit to stand with verbal cues and min to moderate assist to demosntrate improved transitional mobility    Time 6    Period Weeks    Status New    Target Date 09/02/20      PT LONG TERM GOAL #2   Title Patient will be able to stand with min assist without upper extremity support to demonstrate improved standing balance    Time 6    Period Weeks    Status New    Target Date 09/02/20      PT LONG TERM GOAL #3   Title Patient and  wife will report that patient is regularly trying to stand up from lift chair instead of getting +2 support at home    Time 6    Period Weeks    Status New    Target Date 09/02/20                 Plan - 07/25/20 1834    Clinical Impression Statement Reviewed goals and educated importance of compliance with exercise program.  Measurements complete for BLE thigh high compression garment, educated on benefits wiht butler for ease donning, attempted to call [employer] and place order for pt.  Therapist left message for pt and included their contact number to place order.  Pt may benefit from pump, messaged evaluation therapist concerning.  complete with ability to ambulate 67ft prior fatigue with 26" left in walking test.  Included cueing during gait training to stand closer to walker as tendency to push it away, importance of posture to improve balance.  Assistanced pt out wiht some difficulty getting into SUV, cueing for mechanics to assist.    Personal Factors and Comorbidities Comorbidity 1    Comorbidities HTN, CVA, parkinson's    Examination-Activity Limitations Bed Mobility;Stand;Locomotion Level    Stability/Clinical Decision Making Evolving/Moderate complexity    Clinical Decision Making Moderate    Rehab Potential Fair    PT Frequency 3x / week    PT Duration 6 weeks    PT Treatment/Interventions ADLs/Self Care Home Management;Aquatic Therapy;Balance training;Therapeutic exercise;Therapeutic activities;Functional mobility training;Stair training;Gait training;Neuromuscular re-education;Patient/family education;Manual techniques;Dry needling;Passive range of motion    PT Next Visit Plan answer questions regaring compression garments.  Standing balance and progress to NBOS for balance.  STS from lower height as able.  BIG movements.    PT Home Exercise Plan standing at counter, STS from lift chair; 2/4: seated X to V big UE movements.    Consulted and Agree with Plan  of Care Patient;Family member/caregiver    Family Member Consulted Windell Moulding - Wife           Patient will benefit from skilled therapeutic intervention  in order to improve the following deficits and impairments:  Pain,Abnormal gait,Decreased endurance,Increased edema,Decreased knowledge of precautions,Decreased knowledge of use of DME,Decreased activity tolerance,Decreased balance,Decreased mobility,Difficulty walking,Decreased strength,Decreased range of motion,Postural dysfunction  Visit Diagnosis: Balance problem  History of falling  Difficulty in walking, not elsewhere classified     Problem List There are no problems to display for this patient.  Ihor Austin, LPTA/CLT; CBIS [phone]  Aldona Lento 07/25/2020, 6:46 PM  [place] [address] [place], [place], [address] Phone: [phone]   Fax:  [phone]  Name: [place] MRN: [id number] Date of Birth: 28-Jan-1936

## 2020-07-28 ENCOUNTER — Other Ambulatory Visit: Payer: Self-pay

## 2020-07-28 ENCOUNTER — Ambulatory Visit (HOSPITAL_COMMUNITY): Payer: Medicare Other | Admitting: Physical Therapy

## 2020-07-28 DIAGNOSIS — R2689 Other abnormalities of gait and mobility: Secondary | ICD-10-CM | POA: Diagnosis not present

## 2020-07-28 DIAGNOSIS — R262 Difficulty in walking, not elsewhere classified: Secondary | ICD-10-CM

## 2020-07-28 DIAGNOSIS — Z9181 History of falling: Secondary | ICD-10-CM

## 2020-07-28 NOTE — Therapy (Signed)
[place], [place], [address] Phone: [phone]   Fax:  [phone]  Physical Therapy Treatment  Patient Details  Name: [place] MRN: [id number] Date of Birth: 1935-10-25 Referring Provider (PT): Ralph Leyden MD   Encounter Date: 07/28/2020   PT End of Session - 07/28/20 1614    Visit Number 3    Number of Visits 18    Date for PT Re-Evaluation 09/02/20    Authorization Type BCBS no VL or auth    Progress Note Due on Visit 10    PT Start Time 1534    PT Stop Time 1625    PT Time Calculation (min) 51 min    Equipment Utilized During Treatment Gait belt    Activity Tolerance Patient limited by fatigue;Patient tolerated treatment well    Behavior During Therapy [facility] for tasks assessed/performed           Past Medical History:  Diagnosis Date  . Cancer [facility])    prostate  . Enlarged prostate   . Hypertension   . Stroke [facility])    TIA    No past surgical history on file.  There were no vitals filed for this visit.   Subjective Assessment - 07/28/20 1548    Subjective Pt states his Rt big toe hurts him some due to arthritis.  No other issues; accompained by spouse today.    Patient is accompained by: Family member    Pertinent History parkinson's    Currently in Pain? No/denies                             [facility] Adult PT Treatment/Exercise - 07/28/20 0001      Transfers   Transfers Sit to Stand    Sit to Stand 3: Mod assist      Knee/Hip Exercises: Standing   Heel Raises Both;10 reps    Knee Flexion Both;10 reps    Knee Flexion Limitations alternating marching    Hip Abduction Both;10 reps    Abduction Limitations postural cues    Hip Extension Both;10 reps    Extension Limitations postural cues    Gait Training 2MWT 175 with rollator    Other Standing Knee Exercises alternating toe taps onto 4" step 10X with UE assist      Knee/Hip Exercises: Seated   Other Seated  Knee/Hip Exercises X to V UE Big reaching 10x (HEP    Sit to Sand without UE support;5 reps   standard chair with blue airex pad in seat                   PT Short Term Goals - 07/22/20 1062      PT SHORT TERM GOAL #1   Title Patient will report at least 25% improvement in overall symptoms and/or function to demonstrate improved functional mobility    Time 3    Period Weeks    Status New    Target Date 08/12/20      PT SHORT TERM GOAL #2   Title Patient will be able to stand at counter with one upper extremity support and SBA to demonstrate improved standing balance    Time 3    Period Weeks    Status New    Target Date 08/12/20      PT SHORT TERM GOAL #3   Title Patient will be independent in self management strategies to improve quality  of life and functional outcomes.    Time 3    Period Weeks    Status New    Target Date 08/12/20             PT Long Term Goals - 07/22/20 0823      PT LONG TERM GOAL #1   Title Patient will be able to transition from sit to stand with verbal cues and min to moderate assist to demosntrate improved transitional mobility    Time 6    Period Weeks    Status New    Target Date 09/02/20      PT LONG TERM GOAL #2   Title Patient will be able to stand with min assist without upper extremity support to demonstrate improved standing balance    Time 6    Period Weeks    Status New    Target Date 09/02/20      PT LONG TERM GOAL #3   Title Patient and wife will report that patient is regularly trying to stand up from lift chair instead of getting +2 support at home    Time 6    Period Weeks    Status New    Target Date 09/02/20                 Plan - 07/28/20 1614    Clinical Impression Statement Continued to work on LE strength and posture.  Wife reports they called [facility] [facility] this morning and ordered compression stockings for LE's.  Began session with 2 minute walk in which he was able to complete with  encouragement (reported he was tired at 1:30).  Increased distance.  Cues to pick up toes, not shuffle feet, foward gaze and upright posturing during ambulation and througout session today.  Pt tends to push and lean backward rather than weight going forward. Cues to bend knees and "use" LE muscles with sit to stand.  Standing exercises completed today with tactile and verbal cues for posture as tends to lean forward and complete therex quickly.Pt had to go to restroom with several minutes left in session and wife requested help to ambulate out to car and car transfer.  Pt requires min to mod assist for transfers.    Personal Factors and Comorbidities Comorbidity 1    Comorbidities HTN, CVA, parkinson's    Examination-Activity Limitations Bed Mobility;Stand;Locomotion Level    Stability/Clinical Decision Making Evolving/Moderate complexity    Rehab Potential Fair    PT Frequency 3x / week    PT Duration 6 weeks    PT Treatment/Interventions ADLs/Self Care Home Management;Aquatic Therapy;Balance training;Therapeutic exercise;Therapeutic activities;Functional mobility training;Stair training;Gait training;Neuromuscular re-education;Patient/family education;Manual techniques;Dry needling;Passive range of motion    PT Next Visit Plan answer questions regaring compression garments.  Standing balance and progress to NBOS for balance.  STS from lower height as able.  BIG movements.    PT Home Exercise Plan standing at counter, STS from lift chair; 2/4: seated X to V big UE movements.    Consulted and Agree with Plan of Care Patient;Family member/caregiver    Family Member Consulted [place] - Wife           Patient will benefit from skilled therapeutic intervention in order to improve the following deficits and impairments:  Pain,Abnormal gait,Decreased endurance,Increased edema,Decreased knowledge of precautions,Decreased knowledge of use of DME,Decreased activity tolerance,Decreased balance,Decreased  mobility,Difficulty walking,Decreased strength,Decreased range of motion,Postural dysfunction  Visit Diagnosis: Balance problem  History of falling  Difficulty in walking, not elsewhere  classified     Problem List There are no problems to display for this patient.  Teena Irani, PTA/CLT [phone]  Teena Irani 07/28/2020, 4:39 PM  [place] [address] [place], [place], [address] Phone: [phone]   Fax:  [phone]  Name: [place] MRN: [id number] Date of Birth: 1936-05-11

## 2020-07-31 ENCOUNTER — Ambulatory Visit (HOSPITAL_COMMUNITY): Payer: Medicare Other

## 2020-07-31 ENCOUNTER — Encounter (HOSPITAL_COMMUNITY): Payer: Self-pay

## 2020-07-31 ENCOUNTER — Other Ambulatory Visit: Payer: Self-pay

## 2020-07-31 DIAGNOSIS — R2689 Other abnormalities of gait and mobility: Secondary | ICD-10-CM

## 2020-07-31 DIAGNOSIS — Z9181 History of falling: Secondary | ICD-10-CM

## 2020-07-31 DIAGNOSIS — R262 Difficulty in walking, not elsewhere classified: Secondary | ICD-10-CM

## 2020-07-31 NOTE — Therapy (Signed)
[place], [place], [address] Phone: [phone]   Fax:  [phone]  Physical Therapy Treatment  Patient Details  Name: Antonio Park MRN: [id number] Date of Birth: [birth year] Referring Provider (PT): Ralph Leyden MD   Encounter Date: 07/31/2020   PT End of Session - 07/31/20 1714    Visit Number 4    Number of Visits 18    Date for PT Re-Evaluation 09/02/20    Authorization Type BCBS no VL or auth    Progress Note Due on Visit 10    PT Start Time 1534    PT Stop Time 1616    PT Time Calculation (min) 42 min    Equipment Utilized During Treatment Gait belt    Activity Tolerance Patient limited by fatigue;Patient tolerated treatment well    Behavior During Therapy [facility] for tasks assessed/performed           Past Medical History:  Diagnosis Date  . Cancer [facility])    prostate  . Enlarged prostate   . Hypertension   . Stroke [facility])    TIA    History reviewed. No pertinent surgical history.  There were no vitals filed for this visit.   Subjective Assessment - 07/31/20 1606    Subjective Pt stated his feet hurt.  Wife reports they have ordered compression garment, has not arrived yet.    Patient is accompained by: Family member   wife   Pertinent History parkinson's    Currently in Pain? 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      Exercises   Exercises Knee/Hip      Knee/Hip Exercises: Standing   Heel Raises Both;10 reps    Knee Flexion Both;10 reps    Knee Flexion Limitations alternating marching     Hip Abduction Both;10 reps    Abduction Limitations postural cues    Hip Extension Both;10 reps    Extension Limitations postural cues    Other Standing Knee Exercises sidestep 2RT inside // bars    Other Standing Knee Exercises Retrogait 1RT                    PT Short Term Goals - 07/22/20 3335      PT SHORT TERM GOAL #1   Title Patient will report at least 25% improvement in overall symptoms and/or function to demonstrate improved functional mobility    Time 3    Period Weeks    Status New    Target Date 08/12/20      PT SHORT TERM GOAL #2   Title Patient will be able to stand at counter with one upper extremity support and SBA to demonstrate improved standing balance    Time 3    Period Weeks    Status New    Target Date 08/12/20  PT SHORT TERM GOAL #3   Title Patient will be independent in self management strategies to improve quality of life and functional outcomes.    Time 3    Period Weeks    Status New    Target Date 08/12/20             PT Long Term Goals - 07/22/20 0823      PT LONG TERM GOAL #1   Title Patient will be able to transition from sit to stand with verbal cues and min to moderate assist to demosntrate improved transitional mobility    Time 6    Period Weeks    Status New    Target Date 09/02/20      PT LONG TERM GOAL #2   Title Patient will be able to stand with min assist without upper extremity support to demonstrate improved standing balance    Time 6    Period Weeks    Status New    Target Date 09/02/20      PT LONG TERM GOAL #3   Title Patient and wife will report that patient is regularly trying to stand up from lift chair instead of getting +2 support at home    Time 6    Period Weeks    Status New    Target Date 09/02/20                 Plan - 07/31/20 1715    Clinical Impression Statement Continued session focus with LE strengthening, posture and gait training.  Pt required verbal and tactile cueing  through session for posture awareness.  Instructed proper mechanics wiht STS to reduce UE pulling and improve gluteal activation for pushing from chair and foot position to assit with standing.  Added sidestep and retro gait for balance training with BilUE support required for safety.  Pt limited by fatigue required seated rest breaks through session.  EOS pt assisted to car, cueing for mechanics to increase ease getting in with difficulty.    Personal Factors and Comorbidities Comorbidity 1    Comorbidities HTN, CVA, parkinson's    Examination-Activity Limitations Bed Mobility;Stand;Locomotion Level    Stability/Clinical Decision Making Evolving/Moderate complexity    Clinical Decision Making Moderate    Rehab Potential Fair    PT Frequency 3x / week    PT Duration 6 weeks    PT Treatment/Interventions ADLs/Self Care Home Management;Aquatic Therapy;Balance training;Therapeutic exercise;Therapeutic activities;Functional mobility training;Stair training;Gait training;Neuromuscular re-education;Patient/family education;Manual techniques;Dry needling;Passive range of motion    PT Next Visit Plan answer questions regaring compression garments.  Standing balance and progress to NBOS for balance.  STS from lower height as able.  BIG movements.    PT Home Exercise Plan standing at counter, STS from lift chair; 2/4: seated X to V big UE movements.    Consulted and Agree with Plan of Care Patient;Family member/caregiver    Family Member Consulted Rod Holler - Wife           Patient will benefit from skilled therapeutic intervention in order to improve the following deficits and impairments:  Pain,Abnormal gait,Decreased endurance,Increased edema,Decreased knowledge of precautions,Decreased knowledge of use of DME,Decreased activity tolerance,Decreased balance,Decreased mobility,Difficulty walking,Decreased strength,Decreased range of motion,Postural dysfunction  Visit Diagnosis: Difficulty in walking, not  elsewhere classified  Balance problem  History of falling     Problem List There are no problems to display for this patient.  Ihor Austin, LPTA/CLT; CBIS [phone]  Aldona Lento 07/31/2020, 5:20 PM  [facility]  [place], [place], [address] Phone: [phone]   Fax:  [phone]  Name: Antonio Park MRN: [id number] Date of Birth: May 16, 1936

## 2020-08-01 ENCOUNTER — Encounter (HOSPITAL_COMMUNITY): Payer: Self-pay | Admitting: Physical Therapy

## 2020-08-01 ENCOUNTER — Ambulatory Visit (HOSPITAL_COMMUNITY): Payer: Medicare Other | Admitting: Physical Therapy

## 2020-08-01 DIAGNOSIS — R2689 Other abnormalities of gait and mobility: Secondary | ICD-10-CM | POA: Diagnosis not present

## 2020-08-01 DIAGNOSIS — Z9181 History of falling: Secondary | ICD-10-CM

## 2020-08-01 DIAGNOSIS — R262 Difficulty in walking, not elsewhere classified: Secondary | ICD-10-CM

## 2020-08-01 NOTE — Therapy (Signed)
[place], [place], [address] Phone: [phone]   Fax:  [phone]  Physical Therapy Treatment  Patient Details  Name: Antonio Park MRN: [id number] Date of Birth: [birth year] Referring Provider (PT): Ralph Leyden MD   Encounter Date: 08/01/2020   PT End of Session - 08/01/20 1605    Visit Number 5    Number of Visits 18    Date for PT Re-Evaluation 09/02/20    Authorization Type BCBS no VL or auth    Progress Note Due on Visit 10    PT Start Time 1403    PT Stop Time 1447    PT Time Calculation (min) 44 min    Equipment Utilized During Treatment Gait belt    Activity Tolerance Patient limited by fatigue;Patient tolerated treatment well    Behavior During Therapy [facility] for tasks assessed/performed           Past Medical History:  Diagnosis Date  . Cancer [facility])    prostate  . Enlarged prostate   . Hypertension   . Stroke [facility])    TIA    History reviewed. No pertinent surgical history.  There were no vitals filed for this visit.   Subjective Assessment - 08/01/20 1558    Subjective Pt staes that his feet are hurting he has the compression garments    Patient is accompained by: Family member   wife   Pertinent History parkinson's    Currently in Pain? Yes    Pain Score 8     Pain Location Foot    Pain Orientation Right;Left    Pain Descriptors / Indicators Aching;Throbbing    Pain Type Chronic pain    Pain Onset More than a month ago    Pain Frequency Constant    Aggravating Factors  dependent position    Pain Relieving Factors elevation                             [facility] Adult PT Treatment/Exercise - 08/01/20 0001      Manual Therapy   Manual Therapy Manual Lymphatic Drainage (MLD)    Manual therapy comments completed seperate from all aspects of treatment    Manual Lymphatic Drainage (MLD) to decrease edema and obiterate bottle necking caused by socks that pt was  wearing.                  PT Education - 08/01/20 1601    Education Details Therapist educated his caregiver on donning and doffing compression garment, explained to use rubber gloves and not to put the garment in the dryer.  Therapist showed pt Melina Copa, however caregiver states that they would like to try just using rubber gloves first.  Educated to [place] prior to going to bed and to put compression garment on first thing in the morning when LE are at the lowest volume.    Person(s) Educated Child(ren)    Methods Explanation;Demonstration;Tactile cues;Verbal cues    Comprehension Verbalized understanding;Returned demonstration;Need further instruction            PT Short Term Goals - 08/01/20 1615      PT SHORT TERM GOAL #1   Title Patient will report at least 25% improvement in overall symptoms and/or function to demonstrate improved functional mobility    Time 3    Period Weeks    Status On-going    Target Date 08/12/20  PT SHORT TERM GOAL #2   Title Patient will be able to stand at counter with one upper extremity support and SBA to demonstrate improved standing balance    Time 3    Period Weeks    Status On-going    Target Date 08/12/20      PT SHORT TERM GOAL #3   Title Patient will be independent in self management strategies to improve quality of life and functional outcomes.    Time 3    Period Weeks    Status On-going    Target Date 08/12/20             PT Long Term Goals - 08/01/20 1615      PT LONG TERM GOAL #1   Title Patient will be able to transition from sit to stand with verbal cues and min to moderate assist to demosntrate improved transitional mobility    Time 6    Period Weeks    Status On-going      PT LONG TERM GOAL #2   Title Patient will be able to stand with min assist without upper extremity support to demonstrate improved standing balance    Time 6    Period Weeks    Status On-going      PT LONG TERM GOAL #3    Title Patient and wife will report that patient is regularly trying to stand up from lift chair instead of getting +2 support at home    Time 6    Period Weeks    Status On-going                 Plan - 08/01/20 1606    Clinical Impression Statement Pt comes to treatment session with compression garments thar we had requested him to purchace.  Pain in feet is at a 8/10.  When therapist doffed current garments that pt had on there was significant bottlenecking 1/2 way down pt leg where the garment had slide down to .  Therapist used decongestive techniques to decrease volume and obliterate bottlenecking prior to donning new compression garment. Session focused on education of how to correctly don and doff compression garment as well as care of compression garment.  Pt verbalized improvement in pain with compression garment on.    Personal Factors and Comorbidities Comorbidity 1    Comorbidities HTN, CVA, parkinson's    Examination-Activity Limitations Bed Mobility;Stand;Locomotion Level    Stability/Clinical Decision Making Evolving/Moderate complexity    Rehab Potential Fair    PT Frequency 3x / week    PT Duration 6 weeks    PT Treatment/Interventions ADLs/Self Care Home Management;Aquatic Therapy;Balance training;Therapeutic exercise;Therapeutic activities;Functional mobility training;Stair training;Gait training;Neuromuscular re-education;Patient/family education;Manual techniques;Dry needling;Passive range of motion    PT Next Visit Plan answer questions regaring compression garments.  Continue exercises focusing on posture exercises as well as bed mobility then progess to sit to stand followed by   Standing balance and progress to NBOS for balance.  Use BIG and POWER philosophy throughout treatment.    PT Home Exercise Plan standing at counter, STS from lift chair; 2/4: seated X to V big UE movements.    Consulted and Agree with Plan of Care Patient;Family member/caregiver    Family  Member Consulted Rod Holler - Wife           Patient will benefit from skilled therapeutic intervention in order to improve the following deficits and impairments:  Pain,Abnormal gait,Decreased endurance,Increased edema,Decreased knowledge of precautions,Decreased knowledge of use of DME,Decreased  activity tolerance,Decreased balance,Decreased mobility,Difficulty walking,Decreased strength,Decreased range of motion,Postural dysfunction  Visit Diagnosis: Difficulty in walking, not elsewhere classified  Balance problem  History of falling     Problem List There are no problems to display for this patient.  [name], [place] CLT [phone] 08/01/2020, 4:16 PM  [facility] [address] [place], [place], [address] Phone: [phone]   Fax:  [phone]  Name: Albie Arizpe MRN: [id number] Date of Birth: 21-May-1936

## 2020-08-04 ENCOUNTER — Other Ambulatory Visit: Payer: Self-pay

## 2020-08-04 ENCOUNTER — Encounter (HOSPITAL_COMMUNITY): Payer: Self-pay | Admitting: Physical Therapy

## 2020-08-04 ENCOUNTER — Ambulatory Visit (HOSPITAL_COMMUNITY): Payer: Medicare Other | Admitting: Physical Therapy

## 2020-08-04 DIAGNOSIS — R2689 Other abnormalities of gait and mobility: Secondary | ICD-10-CM

## 2020-08-04 DIAGNOSIS — Z9181 History of falling: Secondary | ICD-10-CM

## 2020-08-04 DIAGNOSIS — R262 Difficulty in walking, not elsewhere classified: Secondary | ICD-10-CM

## 2020-08-04 NOTE — Patient Instructions (Signed)
Access Code: [email or web address] URL: [email or web address] Date: 08/04/2020 Prepared by: Mitzi Hansen Shayona Hibbitts  Exercises Seated Alternating Side Stretch with Arm Overhead - 1 x daily - 7 x weekly - 2 sets - 10 reps - 5 second hold

## 2020-08-04 NOTE — Therapy (Signed)
[place], [place], [address] Phone: [phone]   Fax:  [phone]  Physical Therapy Treatment  Patient Details  Name: Antonio Park MRN: [id number] Date of Birth: 08-01-1935 Referring Provider (PT): Ralph Leyden MD   Encounter Date: 08/04/2020   PT End of Session - 08/04/20 1311    Visit Number 6    Number of Visits 18    Date for PT Re-Evaluation 09/02/20    Authorization Type BCBS no VL or auth    Progress Note Due on Visit 10    PT Start Time 1312    PT Stop Time 1359    PT Time Calculation (min) 47 min    Equipment Utilized During Treatment Gait belt    Activity Tolerance Patient limited by fatigue;Patient tolerated treatment well    Behavior During Therapy [facility] for tasks assessed/performed           Past Medical History:  Diagnosis Date  . Cancer [facility])    prostate  . Enlarged prostate   . Hypertension   . Stroke [facility])    TIA    History reviewed. No pertinent surgical history.  There were no vitals filed for this visit.   Subjective Assessment - 08/04/20 1313    Subjective Patient states he is feeling good. His wife is able to get his compression on.    Patient is accompained by: Family member   wife   Pertinent History parkinson's    Currently in Pain? No/denies    Pain Onset More than a month ago                             [facility] Adult PT Treatment/Exercise - 08/04/20 0001      Knee/Hip Exercises: Standing   Knee Flexion Both;10 reps    Knee Flexion Limitations alternating marching    Hip Flexion 10 reps    Hip Flexion Limitations alternating hamstring curls      Knee/Hip Exercises: Seated   Other Seated Knee/Hip Exercises X to V UE Big reaching 10x    Other Seated Knee/Hip Exercises Row 2x 15 blue band; overhead stretches laterally 10x 5 seconds bilaterally    Sit to Sand 5 reps;2 sets                  PT Education - 08/04/20 1312    Education  Details HEP    Person(s) Educated Patient;Spouse    Methods Explanation    Comprehension Verbalized understanding            PT Short Term Goals - 08/01/20 1615      PT SHORT TERM GOAL #1   Title Patient will report at least 25% improvement in overall symptoms and/or function to demonstrate improved functional mobility    Time 3    Period Weeks    Status On-going    Target Date 08/12/20      PT SHORT TERM GOAL #2   Title Patient will be able to stand at counter with one upper extremity support and SBA to demonstrate improved standing balance    Time 3    Period Weeks    Status On-going    Target Date 08/12/20      PT SHORT TERM GOAL #3   Title Patient will be independent in self management strategies to improve quality of life and functional outcomes.    Time 3    Period  Weeks    Status On-going    Target Date 08/12/20             PT Long Term Goals - 08/01/20 1615      PT LONG TERM GOAL #1   Title Patient will be able to transition from sit to stand with verbal cues and min to moderate assist to demosntrate improved transitional mobility    Time 6    Period Weeks    Status On-going      PT LONG TERM GOAL #2   Title Patient will be able to stand with min assist without upper extremity support to demonstrate improved standing balance    Time 6    Period Weeks    Status On-going      PT LONG TERM GOAL #3   Title Patient and wife will report that patient is regularly trying to stand up from lift chair instead of getting +2 support at home    Time 6    Period Weeks    Status On-going                 Plan - 08/04/20 1312    Clinical Impression Statement Patient requires mod/max assist with all transfers and sit to stands. Patient unable to perform heel raises secondary to painful R toe. Patient requires cueing for posture throughout session with limited carry over. Patient completes seated postural strengthening exercises with intermittent cueing for  mechanics. Began overhead reaching stretches for improved trunk mobility. Patient assisted to SUV at end of session with wife.  Patient will continue to benefit from skilled physical therapy in order to reduce impairment and improve function.    Personal Factors and Comorbidities Comorbidity 1    Comorbidities HTN, CVA, parkinson's    Examination-Activity Limitations Bed Mobility;Stand;Locomotion Level    Stability/Clinical Decision Making Evolving/Moderate complexity    Rehab Potential Fair    PT Frequency 3x / week    PT Duration 6 weeks    PT Treatment/Interventions ADLs/Self Care Home Management;Aquatic Therapy;Balance training;Therapeutic exercise;Therapeutic activities;Functional mobility training;Stair training;Gait training;Neuromuscular re-education;Patient/family education;Manual techniques;Dry needling;Passive range of motion    PT Next Visit Plan Continue exercises focusing on posture exercises as well as bed mobility then progess to sit to stand followed by   Standing balance and progress to NBOS for balance.  Use BIG and POWER philosophy throughout treatment.    PT Home Exercise Plan standing at counter, STS from lift chair; 2/4: seated X to V big UE movements. 2/14  over head stretches    Consulted and Agree with Plan of Care Patient;Family member/caregiver    Family Member Consulted Rod Holler - Wife           Patient will benefit from skilled therapeutic intervention in order to improve the following deficits and impairments:  Pain,Abnormal gait,Decreased endurance,Increased edema,Decreased knowledge of precautions,Decreased knowledge of use of DME,Decreased activity tolerance,Decreased balance,Decreased mobility,Difficulty walking,Decreased strength,Decreased range of motion,Postural dysfunction  Visit Diagnosis: Difficulty in walking, not elsewhere classified  Balance problem  History of falling     Problem List There are no problems to display for this  patient.    2:03 PM, 08/04/20 Mearl Latin PT, DPT Physical Therapist at [place], [place], [address] Phone: [phone]   Fax:  [phone]  Name: Antonio Park MRN: [id number] Date of Birth: 03/17/1936

## 2020-08-07 ENCOUNTER — Encounter (HOSPITAL_COMMUNITY): Payer: Self-pay | Admitting: Physical Therapy

## 2020-08-07 ENCOUNTER — Other Ambulatory Visit: Payer: Self-pay

## 2020-08-07 ENCOUNTER — Ambulatory Visit (HOSPITAL_COMMUNITY): Payer: Medicare Other | Admitting: Physical Therapy

## 2020-08-07 DIAGNOSIS — R2689 Other abnormalities of gait and mobility: Secondary | ICD-10-CM

## 2020-08-07 DIAGNOSIS — R262 Difficulty in walking, not elsewhere classified: Secondary | ICD-10-CM

## 2020-08-07 DIAGNOSIS — Z9181 History of falling: Secondary | ICD-10-CM

## 2020-08-07 NOTE — Therapy (Signed)
[place], [place], [address] Phone: [phone]   Fax:  [phone]  Physical Therapy Treatment  Patient Details  Name: Antonio Park MRN: [id number] Date of Birth: 06/28/1935 Referring Provider (PT): Ralph Leyden MD   Encounter Date: 08/07/2020   PT End of Session - 08/07/20 1550    Visit Number 7    Number of Visits 18    Date for PT Re-Evaluation 09/02/20    Authorization Type BCBS no VL or auth    Progress Note Due on Visit 10    PT Start Time 1540    PT Stop Time 1620    PT Time Calculation (min) 40 min    Equipment Utilized During Treatment Gait belt    Activity Tolerance Patient limited by fatigue;Patient tolerated treatment well    Behavior During Therapy [facility] for tasks assessed/performed           Past Medical History:  Diagnosis Date  . Cancer [facility])    prostate  . Enlarged prostate   . Hypertension   . Stroke [facility])    TIA    History reviewed. No pertinent surgical history.  There were no vitals filed for this visit.   Subjective Assessment - 08/07/20 1550    Subjective Pt stated he is feeling okay.  Wife reoprts she is able to put his compression on.    Patient is accompained by: Family member    Pertinent History parkinson's              [facility] PT Assessment - 08/07/20 0001      Assessment   Medical Diagnosis parkinsonism    Referring Provider (PT) Ralph Leyden MD                         [facility] Adult PT Treatment/Exercise - 08/07/20 0001      Transfers   Transfers Sit to Stand    Sit to Stand 3: Mod assist    Stand to Sit 3: Mod assist;2: [employer] manager for body mechanics    Comments physcial assist for standing with one hand assist - getting hips forward - 20 minutes practice - PT with pressure on glutes/back to push into extension      Ambulation/Gait   Ambulation/Gait Yes    Gait Comments walking to car with min assiat and  rollater with cues to stand up tall      Knee/Hip Exercises: Standing   Knee Flexion Both;10 reps    Knee Flexion Limitations alternating marching    Hip Flexion 10 reps    Hip Flexion Limitations alternating hamstring curls                  PT Education - 08/07/20 1622    Education Details on how to transfer patient and how to practice sit to stands at home. practiced wtih wife and discussed safe environment and benefits of transfering this way.    Person(s) Educated Spouse    Methods Explanation;Demonstration    Comprehension Verbalized understanding;Returned demonstration            PT Short Term Goals - 08/01/20 1615      PT SHORT TERM GOAL #1   Title Patient will report at least 25% improvement in overall symptoms and/or function to demonstrate improved functional mobility    Time 3    Period Weeks    Status On-going  Target Date 08/12/20      PT SHORT TERM GOAL #2   Title Patient will be able to stand at counter with one upper extremity support and SBA to demonstrate improved standing balance    Time 3    Period Weeks    Status On-going    Target Date 08/12/20      PT SHORT TERM GOAL #3   Title Patient will be independent in self management strategies to improve quality of life and functional outcomes.    Time 3    Period Weeks    Status On-going    Target Date 08/12/20             PT Long Term Goals - 08/01/20 1615      PT LONG TERM GOAL #1   Title Patient will be able to transition from sit to stand with verbal cues and min to moderate assist to demosntrate improved transitional mobility    Time 6    Period Weeks    Status On-going      PT LONG TERM GOAL #2   Title Patient will be able to stand with min assist without upper extremity support to demonstrate improved standing balance    Time 6    Period Weeks    Status On-going      PT LONG TERM GOAL #3   Title Patient and wife will report that patient is regularly trying to stand up from  lift chair instead of getting +2 support at home    Time 6    Period Weeks    Status On-going                 Plan - 08/07/20 1602    Clinical Impression Statement Overall patient tolerated session well but fatigued with exercises. Cues to stand up tall throughout session both physical and verbal. Educated wife on how to assist with sit to stands at home to help with strengthening and HEP. Patient with reports of pain with standing in back and toe but this eased up towards the end, patient did report his back pain was a hurt so good pain at times. Will continue with current POC as tolerated.    Personal Factors and Comorbidities Comorbidity 1    Comorbidities HTN, CVA, parkinson's    Examination-Activity Limitations Bed Mobility;Stand;Locomotion Level    Stability/Clinical Decision Making Evolving/Moderate complexity    Rehab Potential Fair    PT Frequency 3x / week    PT Duration 6 weeks    PT Treatment/Interventions ADLs/Self Care Home Management;Aquatic Therapy;Balance training;Therapeutic exercise;Therapeutic activities;Functional mobility training;Stair training;Gait training;Neuromuscular re-education;Patient/family education;Manual techniques;Dry needling;Passive range of motion    PT Next Visit Plan Continue exercises focusing on posture exercises as well as bed mobility then progess to sit to stand followed by   Standing balance and progress to NBOS for balance.  Use BIG and POWER philosophy throughout treatment.    PT Home Exercise Plan standing at counter, STS from lift chair; 2/4: seated X to V big UE movements. 2/14  over head stretches    Consulted and Agree with Plan of Care Patient;Family member/caregiver    Family Member Consulted Rod Holler - Wife           Patient will benefit from skilled therapeutic intervention in order to improve the following deficits and impairments:  Pain,Abnormal gait,Decreased endurance,Increased edema,Decreased knowledge of  precautions,Decreased knowledge of use of DME,Decreased activity tolerance,Decreased balance,Decreased mobility,Difficulty walking,Decreased strength,Decreased range of motion,Postural dysfunction  Visit Diagnosis:  Difficulty in walking, not elsewhere classified  Balance problem  History of falling     Problem List There are no problems to display for this patient.  4:26 PM, 08/07/20 Jerene Pitch, DPT Physical Therapy with [facility]  [phone] office  [place] [address] [place], [place], [address] Phone: [phone]   Fax:  [phone]  Name: Antonio Park MRN: [id number] Date of Birth: 03-27-1936

## 2020-08-08 ENCOUNTER — Encounter (HOSPITAL_COMMUNITY): Payer: Self-pay

## 2020-08-08 ENCOUNTER — Ambulatory Visit (HOSPITAL_COMMUNITY): Payer: Medicare Other

## 2020-08-08 DIAGNOSIS — R2689 Other abnormalities of gait and mobility: Secondary | ICD-10-CM | POA: Diagnosis not present

## 2020-08-08 DIAGNOSIS — R262 Difficulty in walking, not elsewhere classified: Secondary | ICD-10-CM

## 2020-08-08 DIAGNOSIS — Z9181 History of falling: Secondary | ICD-10-CM

## 2020-08-08 NOTE — Therapy (Signed)
[place], [place], [address] Phone: [phone]   Fax:  [phone]  Physical Therapy Treatment  Patient Details  Name: Antonio Park MRN: [id number] Date of Birth: Sep 22, 1935 Referring Provider (PT): Ralph Leyden MD   Encounter Date: 08/08/2020   PT End of Session - 08/08/20 1534    Visit Number 8    Number of Visits 18    Date for PT Re-Evaluation 09/02/20    Authorization Type BCBS no VL or auth    Progress Note Due on Visit 10    PT Start Time 1448    PT Stop Time 1530    PT Time Calculation (min) 42 min    Equipment Utilized During Treatment Gait belt    Activity Tolerance Patient limited by fatigue;Patient tolerated treatment well;Patient limited by pain    Behavior During Therapy [facility] for tasks assessed/performed           Past Medical History:  Diagnosis Date  . Cancer [facility])    prostate  . Enlarged prostate   . Hypertension   . Stroke [facility])    TIA    History reviewed. No pertinent surgical history.  There were no vitals filed for this visit.   Subjective Assessment - 08/08/20 1504    Subjective Pt stated his feet are bother him today.  WIfe reports ease putting on his compression socks                             [facility] Adult PT Treatment/Exercise - 08/08/20 0001      Bed Mobility   Bed Mobility Rolling Right;Rolling Left;Right Sidelying to Sit;Sit to Sidelying Right   Scooting to EOB   Rolling Right Supervision/verbal cueing;Contact Guard/Touching assist    Rolling Left Supervision/Verbal cueing    Right Sidelying to Sit Supervision/Verbal cueing   log rolling   Sit to Sidelying Right Supervision/Verbal cueing;Minimal Assistance - Patient > 75%   log rolling     Transfers   Transfers Sit to Stand    Sit to Stand 3: Mod assist    Stand to Sit 3: Mod assist    Transfer Cueing Cueing for body mechanics, feet under, "nose over toes", squeeze hips, educated on good  posture upon standing    Comments demonstration, verbal and tactile cueing      Knee/Hip Exercises: Supine   Bridges 4 sets;5 reps    Bridges Limitations 5" holds                    PT Short Term Goals - 08/01/20 1615      PT SHORT TERM GOAL #1   Title Patient will report at least 25% improvement in overall symptoms and/or function to demonstrate improved functional mobility    Time 3    Period Weeks    Status On-going    Target Date 08/12/20      PT SHORT TERM GOAL #2   Title Patient will be able to stand at counter with one upper extremity support and SBA to demonstrate improved standing balance    Time 3    Period Weeks    Status On-going    Target Date 08/12/20      PT SHORT TERM GOAL #3   Title Patient will be independent in self management strategies to improve quality of life and functional outcomes.    Time 3    Period Weeks  Status On-going    Target Date 08/12/20             PT Long Term Goals - 08/01/20 1615      PT LONG TERM GOAL #1   Title Patient will be able to transition from sit to stand with verbal cues and min to moderate assist to demosntrate improved transitional mobility    Time 6    Period Weeks    Status On-going      PT LONG TERM GOAL #2   Title Patient will be able to stand with min assist without upper extremity support to demonstrate improved standing balance    Time 6    Period Weeks    Status On-going      PT LONG TERM GOAL #3   Title Patient and wife will report that patient is regularly trying to stand up from lift chair instead of getting +2 support at home    Time 6    Period Weeks    Status On-going                 Plan - 08/08/20 1535    Clinical Impression Statement Educated proper mechanics to assist with independent bed mobility.  Began with log rolling which did require min assist with LE onto bed.  Pt able to complete rolling side to side independently following verbal and tactile cueing.  Able to  log roll to sitting without A.  Cueing for scooting forward to EOB, slow and difficulty though able to complete.  Added bed mobility and bridges to HEP with printout given.  Reviewed mechancis with STS wiht mod A required and verbal, tactile and demonstration required.  Improved mechanics wiht reps.  Min A wiht gait training to car, cueing to increased stride length and posture required through session.    Personal Factors and Comorbidities Comorbidity 1    Comorbidities HTN, CVA, parkinson's    Examination-Activity Limitations Bed Mobility;Stand;Locomotion Level    Stability/Clinical Decision Making Evolving/Moderate complexity    Clinical Decision Making Moderate    Rehab Potential Fair    PT Frequency 3x / week    PT Duration 6 weeks    PT Treatment/Interventions ADLs/Self Care Home Management;Aquatic Therapy;Balance training;Therapeutic exercise;Therapeutic activities;Functional mobility training;Stair training;Gait training;Neuromuscular re-education;Patient/family education;Manual techniques;Dry needling;Passive range of motion    PT Next Visit Plan Continue exercises focusing on posture exercises as well as bed mobility then progess to sit to stand followed by   Standing balance and progress to NBOS for balance.  Use BIG and POWER philosophy throughout treatment.    PT Home Exercise Plan standing at counter, STS from lift chair; 2/4: seated X to V big UE movements. 2/14  over head stretches; 2/18: bridge and bed mobility (log rolling)    Consulted and Agree with Plan of Care Patient;Family member/caregiver    Family Member Consulted Rod Holler - Wife           Patient will benefit from skilled therapeutic intervention in order to improve the following deficits and impairments:  Pain,Abnormal gait,Decreased endurance,Increased edema,Decreased knowledge of precautions,Decreased knowledge of use of DME,Decreased activity tolerance,Decreased balance,Decreased mobility,Difficulty walking,Decreased  strength,Decreased range of motion,Postural dysfunction  Visit Diagnosis: Difficulty in walking, not elsewhere classified  Balance problem  History of falling     Problem List There are no problems to display for this patient.  Ihor Austin, LPTA/CLT; CBIS [phone]  Aldona Lento 08/08/2020, 5:57 PM  [place]  [place], [place], [address] Phone: [phone]   Fax:  [phone]  Name: Antonio Park MRN: [id number] Date of Birth: [birth year]

## 2020-08-11 ENCOUNTER — Ambulatory Visit (HOSPITAL_COMMUNITY): Payer: Medicare Other | Admitting: Physical Therapy

## 2020-08-11 ENCOUNTER — Encounter (HOSPITAL_COMMUNITY): Payer: Self-pay | Admitting: Physical Therapy

## 2020-08-11 ENCOUNTER — Other Ambulatory Visit: Payer: Self-pay

## 2020-08-11 DIAGNOSIS — R2689 Other abnormalities of gait and mobility: Secondary | ICD-10-CM

## 2020-08-11 DIAGNOSIS — R262 Difficulty in walking, not elsewhere classified: Secondary | ICD-10-CM

## 2020-08-11 DIAGNOSIS — Z9181 History of falling: Secondary | ICD-10-CM

## 2020-08-11 NOTE — Therapy (Signed)
[place], [place], [address] Phone: [phone]   Fax:  [phone]  Physical Therapy Treatment  Patient Details  Name: Antonio Park MRN: [id number] Date of Birth: [birth year] Referring Provider (PT): Ralph Leyden MD   Encounter Date: 08/11/2020   PT End of Session - 08/11/20 1453    Visit Number 9    Number of Visits 18    Date for PT Re-Evaluation 09/02/20    Authorization Type BCBS no VL or auth    Progress Note Due on Visit 10    PT Start Time 4742    PT Stop Time 1534    PT Time Calculation (min) 40 min    Equipment Utilized During Treatment Gait belt    Activity Tolerance Patient limited by fatigue;Patient tolerated treatment well;Patient limited by pain    Behavior During Therapy [facility] for tasks assessed/performed           Past Medical History:  Diagnosis Date  . Cancer [facility])    prostate  . Enlarged prostate   . Hypertension   . Stroke [facility])    TIA    History reviewed. No pertinent surgical history.  There were no vitals filed for this visit.   Subjective Assessment - 08/11/20 1455    Subjective Patient states no falls. Nothing new. She has been working on transfers as shown sometimes.    Patient is accompained by: Family member    Pertinent History parkinson's    Currently in Pain? Yes    Pain Score 5     Pain Location Foot    Pain Orientation Right                             [facility] Adult PT Treatment/Exercise - 08/11/20 0001      Ambulation/Gait   Ambulation/Gait Yes    Gait Comments walking to car with min assist and rollater with cues to stand up tall      Knee/Hip Exercises: Standing   Hip Flexion 10 reps    Hip Flexion Limitations alternating march      Knee/Hip Exercises: Supine   Bridges 3 sets;10 reps    Straight Leg Raises Both;10 reps;2 sets    Other Supine Knee/Hip Exercises lateral trunk rotation 10x bilateral    Other Supine Knee/Hip  Exercises glute sets 10x 5 second holds bilateral                    PT Short Term Goals - 08/01/20 1615      PT SHORT TERM GOAL #1   Title Patient will report at least 25% improvement in overall symptoms and/or function to demonstrate improved functional mobility    Time 3    Period Weeks    Status On-going    Target Date 08/12/20      PT SHORT TERM GOAL #2   Title Patient will be able to stand at counter with one upper extremity support and SBA to demonstrate improved standing balance    Time 3    Period Weeks    Status On-going    Target Date 08/12/20      PT SHORT TERM GOAL #3   Title Patient will be independent in self management strategies to improve quality of life and functional outcomes.    Time 3    Period Weeks    Status On-going    Target Date 08/12/20  PT Long Term Goals - 08/01/20 1615      PT LONG TERM GOAL #1   Title Patient will be able to transition from sit to stand with verbal cues and min to moderate assist to demosntrate improved transitional mobility    Time 6    Period Weeks    Status On-going      PT LONG TERM GOAL #2   Title Patient will be able to stand with min assist without upper extremity support to demonstrate improved standing balance    Time 6    Period Weeks    Status On-going      PT LONG TERM GOAL #3   Title Patient and wife will report that patient is regularly trying to stand up from lift chair instead of getting +2 support at home    Time 6    Period Weeks    Status On-going                 Plan - 08/11/20 1454    Clinical Impression Statement Patient requires mod assist to transfer to standing with use of rollator and cueing for posture and posterior chain activation. Began session with supine exercises for proximal strengthening at mat. Added LTR for truncal mobility. Patient appears more fatigued today compared to prior sessions and requires intermittent rest breaks. Patient requires frequent  cueing for controlled exercises throughout session. Patient with frequent c/o R foot/toe pain. Patient assisted to vehicle with wife at end of session with frequent cueing for posture. Patient will continue to benefit from skilled physical therapy in order to reduce impairment and improve function    Personal Factors and Comorbidities Comorbidity 1    Comorbidities HTN, CVA, parkinson's    Examination-Activity Limitations Bed Mobility;Stand;Locomotion Level    Stability/Clinical Decision Making Evolving/Moderate complexity    Rehab Potential Fair    PT Frequency 3x / week    PT Duration 6 weeks    PT Treatment/Interventions ADLs/Self Care Home Management;Aquatic Therapy;Balance training;Therapeutic exercise;Therapeutic activities;Functional mobility training;Stair training;Gait training;Neuromuscular re-education;Patient/family education;Manual techniques;Dry needling;Passive range of motion    PT Next Visit Plan Continue exercises focusing on posture exercises as well as bed mobility then progess to sit to stand followed by   Standing balance and progress to NBOS for balance.  Use BIG and POWER philosophy throughout treatment.    PT Home Exercise Plan standing at counter, STS from lift chair; 2/4: seated X to V big UE movements. 2/14  over head stretches; 2/18: bridge and bed mobility (log rolling) 2/21 bridge, SLR, LTR    Consulted and Agree with Plan of Care Patient;Family member/caregiver    Family Member Consulted Rod Holler - Wife           Patient will benefit from skilled therapeutic intervention in order to improve the following deficits and impairments:  Pain,Abnormal gait,Decreased endurance,Increased edema,Decreased knowledge of precautions,Decreased knowledge of use of DME,Decreased activity tolerance,Decreased balance,Decreased mobility,Difficulty walking,Decreased strength,Decreased range of motion,Postural dysfunction  Visit Diagnosis: Difficulty in walking, not elsewhere  classified  Balance problem  History of falling     Problem List There are no problems to display for this patient.   3:46 PM, 08/11/20 Mearl Latin PT, DPT Physical Therapist at [place], [place], [address] Phone: [phone]   Fax:  [phone]  Name: Rembert Browe MRN: [id number] Date of Birth: August 17, 1935

## 2020-08-14 ENCOUNTER — Telehealth (HOSPITAL_COMMUNITY): Payer: Self-pay | Admitting: Physical Therapy

## 2020-08-14 ENCOUNTER — Ambulatory Visit (HOSPITAL_COMMUNITY): Payer: Medicare Other | Admitting: Physical Therapy

## 2020-08-14 NOTE — Telephone Encounter (Signed)
Pt's caregiver called and stated pt is not feeling well today and Friday he has a MD apptment. Pt  wanted to cx- will return on Monday.

## 2020-08-15 ENCOUNTER — Ambulatory Visit (HOSPITAL_COMMUNITY): Payer: Medicare Other | Admitting: Physical Therapy

## 2020-08-18 ENCOUNTER — Other Ambulatory Visit: Payer: Self-pay

## 2020-08-18 ENCOUNTER — Ambulatory Visit (HOSPITAL_COMMUNITY): Payer: Medicare Other | Admitting: Physical Therapy

## 2020-08-18 ENCOUNTER — Encounter (HOSPITAL_COMMUNITY): Payer: Self-pay | Admitting: Physical Therapy

## 2020-08-18 DIAGNOSIS — R2689 Other abnormalities of gait and mobility: Secondary | ICD-10-CM

## 2020-08-18 DIAGNOSIS — R262 Difficulty in walking, not elsewhere classified: Secondary | ICD-10-CM

## 2020-08-18 DIAGNOSIS — Z9181 History of falling: Secondary | ICD-10-CM

## 2020-08-18 NOTE — Therapy (Addendum)
[facility] [address] [place], [place], [address] Phone: [phone]   Fax:  [phone]  Physical Therapy Treatment/Progress Note  Patient Details  Name: Antonio Park MRN: [id number] Date of Birth: Feb 09, 1936 Referring Provider (PT): Ralph Leyden MD   Encounter Date: 08/18/2020  Progress Note   Reporting Period 07/21/20 to 08/18/20   See note below for Objective Data and Assessment of Progress/Goals    PT End of Session - 08/18/20 1317    Visit Number 10    Number of Visits 18    Date for PT Re-Evaluation 09/02/20    Authorization Type BCBS no VL or auth    Progress Note Due on Visit 20    PT Start Time 1316    PT Stop Time 1356    PT Time Calculation (min) 40 min    Equipment Utilized During Treatment Gait belt    Activity Tolerance Patient limited by fatigue;Patient tolerated treatment well;Patient limited by pain    Behavior During Therapy [facility] for tasks assessed/performed           Past Medical History:  Diagnosis Date  . Cancer [facility])    prostate  . Enlarged prostate   . Hypertension   . Stroke [facility])    TIA    History reviewed. No pertinent surgical history.  There were no vitals filed for this visit.   Subjective Assessment - 08/18/20 1318    Subjective Patient states his toe is still bothering him. No falls. He is working on his home exercise. Patient states 0% improvement since beginning therapy. Standing up feels good. His getting up hasn't been doing too good.    Patient is accompained by: Family member    Pertinent History parkinson's    Currently in Pain? No/denies              [facility] PT Assessment - 08/18/20 0001      Assessment   Medical Diagnosis parkinsonism    Referring Provider (PT) Ralph Leyden MD      Precautions   Precautions Fall      Observation/Other Assessments   Observations Ambulates with rollator, slouched in seated      Transfers   Transfers Sit to Stand    Sit  to Stand 3: Mod assist    Stand to Sit 3: Mod assist    Transfer Cueing cueing for mechanics and sequencing    Comments demonstration, verbal and tactile cueing      Ambulation/Gait   Ambulation/Gait Yes                         [facility] Adult PT Treatment/Exercise - 08/18/20 0001      Ambulation/Gait   Gait Comments walking to car with min assist and rollater with cues to stand up tall and for stepping      Knee/Hip Exercises: Standing   Hip Abduction Both;10 reps;2 sets    Hip Extension Both;10 reps    Extension Limitations 2 sets postural cueing      Knee/Hip Exercises: Seated   Sit to Sand 5 reps;2 sets                  PT Education - 08/18/20 1317    Education Details HEP, exercise mechanics    Person(s) Educated Patient    Methods Explanation;Demonstration    Comprehension Verbalized understanding;Returned demonstration            PT Short Term Goals -  08/18/20 1334      PT SHORT TERM GOAL #1   Title Patient will report at least 25% improvement in overall symptoms and/or function to demonstrate improved functional mobility    Time 3    Period Weeks    Status On-going    Target Date 08/12/20      PT SHORT TERM GOAL #2   Title Patient will be able to stand at counter with one upper extremity support and SBA to demonstrate improved standing balance    Time 3    Period Weeks    Status On-going    Target Date 08/12/20      PT SHORT TERM GOAL #3   Title Patient will be independent in self management strategies to improve quality of life and functional outcomes.    Time 3    Period Weeks    Status Achieved    Target Date 08/12/20             PT Long Term Goals - 08/01/20 1615      PT LONG TERM GOAL #1   Title Patient will be able to transition from sit to stand with verbal cues and min to moderate assist to demosntrate improved transitional mobility    Time 6    Period Weeks    Status On-going      PT LONG TERM GOAL #2   Title  Patient will be able to stand with min assist without upper extremity support to demonstrate improved standing balance    Time 6    Period Weeks    Status On-going      PT LONG TERM GOAL #3   Title Patient and wife will report that patient is regularly trying to stand up from lift chair instead of getting +2 support at home    Time 6    Period Weeks    Status On-going                 Plan - 08/18/20 1317    Clinical Impression Statement Patient has met 1 goal at this point in time with ability to complete HEP intermittently without cueing. Patient continues to require assist and cueing for all mobility. Remaining goals not met due to impaired strength, balance, gait, and functional mobility. Patient completes sit to stand today with cueing for "launching" forward which patient requires less assist to complete with cueing. He continues to require bilateral UE support and assist for standing balance as well as frequent cueing for posture throughout session. Patient will continue to benefit from skilled physical therapy in order to reduce impairment and improve function.    Personal Factors and Comorbidities Comorbidity 1    Comorbidities HTN, CVA, parkinson's    Examination-Activity Limitations Bed Mobility;Stand;Locomotion Level    Stability/Clinical Decision Making Evolving/Moderate complexity    Rehab Potential Fair    PT Frequency 3x / week    PT Duration 6 weeks    PT Treatment/Interventions ADLs/Self Care Home Management;Aquatic Therapy;Balance training;Therapeutic exercise;Therapeutic activities;Functional mobility training;Stair training;Gait training;Neuromuscular re-education;Patient/family education;Manual techniques;Dry needling;Passive range of motion    PT Next Visit Plan Continue exercises focusing on posture exercises as well as bed mobility then progess to sit to stand followed by   Standing balance and progress to NBOS for balance.  Use BIG and POWER philosophy  throughout treatment.    PT Home Exercise Plan standing at counter, STS from lift chair; 2/4: seated X to V big UE movements. 2/14  over head stretches; 2/18:  bridge and bed mobility (log rolling) 2/21 bridge, SLR, LTR    Consulted and Agree with Plan of Care Patient;Family member/caregiver    Family Member Consulted Rod Holler - Wife           Patient will benefit from skilled therapeutic intervention in order to improve the following deficits and impairments:  Pain,Abnormal gait,Decreased endurance,Increased edema,Decreased knowledge of precautions,Decreased knowledge of use of DME,Decreased activity tolerance,Decreased balance,Decreased mobility,Difficulty walking,Decreased strength,Decreased range of motion,Postural dysfunction  Visit Diagnosis: Difficulty in walking, not elsewhere classified  Balance problem  History of falling     Problem List There are no problems to display for this patient.   1:59 PM, 08/18/20 Mearl Latin PT, DPT Physical Therapist at [facility] [place], [place], [address] Phone: [phone]   Fax:  [phone]  Name: Oswald Pott MRN: [id number] Date of Birth: March 14, 1936

## 2020-08-21 ENCOUNTER — Other Ambulatory Visit: Payer: Self-pay

## 2020-08-21 ENCOUNTER — Ambulatory Visit (HOSPITAL_COMMUNITY): Payer: Medicare Other | Attending: Internal Medicine | Admitting: Physical Therapy

## 2020-08-21 ENCOUNTER — Encounter (HOSPITAL_COMMUNITY): Payer: Self-pay | Admitting: Physical Therapy

## 2020-08-21 DIAGNOSIS — R262 Difficulty in walking, not elsewhere classified: Secondary | ICD-10-CM | POA: Diagnosis not present

## 2020-08-21 DIAGNOSIS — R2689 Other abnormalities of gait and mobility: Secondary | ICD-10-CM | POA: Insufficient documentation

## 2020-08-21 DIAGNOSIS — Z9181 History of falling: Secondary | ICD-10-CM | POA: Diagnosis present

## 2020-08-21 NOTE — Therapy (Signed)
[facility] [place], [place], [address] Phone: [phone]   Fax:  [phone]  Physical Therapy Treatment  Patient Details  Name: Antonio Park MRN: [id number] Date of Birth: 12/11/1935 Referring Provider (PT): Ralph Leyden MD   Encounter Date: 08/21/2020   PT End of Session - 08/21/20 1401    Visit Number 11    Number of Visits 18    Date for PT Re-Evaluation 09/02/20    Authorization Type BCBS no VL or auth    Progress Note Due on Visit 20    PT Start Time 1401    PT Stop Time 1440    PT Time Calculation (min) 39 min    Equipment Utilized During Treatment Gait belt    Activity Tolerance Patient limited by fatigue;Patient tolerated treatment well;Patient limited by pain    Behavior During Therapy [facility] for tasks assessed/performed           Past Medical History:  Diagnosis Date  . Cancer [facility])    prostate  . Enlarged prostate   . Hypertension   . Stroke [facility])    TIA    History reviewed. No pertinent surgical history.  There were no vitals filed for this visit.   Subjective Assessment - 08/21/20 1420    Subjective States that his foot isn't hurting him that bad right now. States that he is doing his exercises at least 2 times a day. States that he doesn't have a specific chair to practice the up and downs well but sometimes does it in bed.    Patient is accompained by: Family member    Pertinent History parkinson's    Currently in Pain? No/denies              [facility] PT Assessment - 08/21/20 0001      Assessment   Medical Diagnosis parkinsonism    Referring Provider (PT) Ralph Leyden MD                         [facility] Adult PT Treatment/Exercise - 08/21/20 0001      Transfers   Comments car transfer with mod to max assist with legs      Ambulation/Gait   Ambulation/Gait Yes    Gait Comments walkign to and from car and treatment area with rollator and min assist - verbal cues  to take bigger steps and to stand up tall      Knee/Hip Exercises: Standing   Other Standing Knee Exercises standign tall with 2 hand support and tactile cues to get butt under himself x3 to max standing tolerance      Knee/Hip Exercises: Seated   Other Seated Knee/Hip Exercises BIG movements - seated - arms and legs in sequence with PT and without and verbal sounds for cues. - 15 minutes    Sit to [employer] 10 reps                    PT Short Term Goals - 08/18/20 1334      PT SHORT TERM GOAL #1   Title Patient will report at least 25% improvement in overall symptoms and/or function to demonstrate improved functional mobility    Time 3    Period Weeks    Status On-going    Target Date 08/12/20      PT SHORT TERM GOAL #2   Title Patient will be able to stand at counter with one upper extremity  support and SBA to demonstrate improved standing balance    Time 3    Period Weeks    Status On-going    Target Date 08/12/20      PT SHORT TERM GOAL #3   Title Patient will be independent in self management strategies to improve quality of life and functional outcomes.    Time 3    Period Weeks    Status Achieved    Target Date 08/12/20             PT Long Term Goals - 08/01/20 1615      PT LONG TERM GOAL #1   Title Patient will be able to transition from sit to stand with verbal cues and min to moderate assist to demosntrate improved transitional mobility    Time 6    Period Weeks    Status On-going      PT LONG TERM GOAL #2   Title Patient will be able to stand with min assist without upper extremity support to demonstrate improved standing balance    Time 6    Period Weeks    Status On-going      PT LONG TERM GOAL #3   Title Patient and wife will report that patient is regularly trying to stand up from lift chair instead of getting +2 support at home    Time 6    Period Weeks    Status On-going                 Plan - 08/21/20 1401    Clinical  Impression Statement Patient continues to fatigue quickly. Reported increased toe pain towards end of session. Added BIG movements which patient required verbal and tactile cues throughout to improve motion. Legs  continue to be swollen despite compression garment use which increased weight of leg and makes lifting legs more challenging. Improved ability to shift weight forward with sit to stand noted today. Will continue with current POC.    Personal Factors and Comorbidities Comorbidity 1    Comorbidities HTN, CVA, parkinson's    Examination-Activity Limitations Bed Mobility;Stand;Locomotion Level    Stability/Clinical Decision Making Evolving/Moderate complexity    Rehab Potential Fair    PT Frequency 3x / week    PT Duration 6 weeks    PT Treatment/Interventions ADLs/Self Care Home Management;Aquatic Therapy;Balance training;Therapeutic exercise;Therapeutic activities;Functional mobility training;Stair training;Gait training;Neuromuscular re-education;Patient/family education;Manual techniques;Dry needling;Passive range of motion    PT Next Visit Plan BIG movements, Continue exercises focusing on posture exercises as well as bed mobility then progess to sit to stand followed by   Standing balance and progress to NBOS for balance.  Use BIG and POWER philosophy throughout treatment.    PT Home Exercise Plan standing at counter, STS from lift chair; 2/4: seated X to V big UE movements. 2/14  over head stretches; 2/18: bridge and bed mobility (log rolling) 2/21 bridge, SLR, LTR    Consulted and Agree with Plan of Care Patient;Family member/caregiver    Family Member Consulted Rod Holler - Wife           Patient will benefit from skilled therapeutic intervention in order to improve the following deficits and impairments:  Pain,Abnormal gait,Decreased endurance,Increased edema,Decreased knowledge of precautions,Decreased knowledge of use of DME,Decreased activity tolerance,Decreased balance,Decreased  mobility,Difficulty walking,Decreased strength,Decreased range of motion,Postural dysfunction  Visit Diagnosis: Difficulty in walking, not elsewhere classified  Balance problem  History of falling     Problem List There are no problems to display for this  patient.  2:43 PM, 08/21/20 Jerene Pitch, DPT Physical Therapy with [facility]  [phone] office  [facility] [address] [place], [place], [address] Phone: [phone]   Fax:  [phone]  Name: Audry Pecina MRN: [id number] Date of Birth: June 11, 1936

## 2020-08-22 ENCOUNTER — Ambulatory Visit (HOSPITAL_COMMUNITY): Payer: BLUE CROSS/BLUE SHIELD

## 2020-08-25 ENCOUNTER — Telehealth (HOSPITAL_COMMUNITY): Payer: Self-pay | Admitting: Physical Therapy

## 2020-08-25 ENCOUNTER — Ambulatory Visit (HOSPITAL_COMMUNITY): Payer: Medicare Other | Admitting: Physical Therapy

## 2020-08-25 NOTE — Telephone Encounter (Signed)
pt's wife called to cx today's appt due to he is not feeling well

## 2020-08-28 ENCOUNTER — Ambulatory Visit (HOSPITAL_COMMUNITY): Payer: Medicare Other | Admitting: Physical Therapy

## 2020-08-29 ENCOUNTER — Ambulatory Visit (HOSPITAL_COMMUNITY): Payer: Medicare Other | Admitting: Physical Therapy

## 2020-09-12 ENCOUNTER — Ambulatory Visit (HOSPITAL_COMMUNITY): Payer: Medicare Other

## 2020-09-15 ENCOUNTER — Ambulatory Visit (HOSPITAL_COMMUNITY): Payer: Medicare Other

## 2020-09-15 ENCOUNTER — Other Ambulatory Visit: Payer: Self-pay

## 2020-09-15 DIAGNOSIS — R2689 Other abnormalities of gait and mobility: Secondary | ICD-10-CM

## 2020-09-15 DIAGNOSIS — R262 Difficulty in walking, not elsewhere classified: Secondary | ICD-10-CM

## 2020-09-15 DIAGNOSIS — Z9181 History of falling: Secondary | ICD-10-CM

## 2020-09-15 NOTE — Therapy (Addendum)
[facility] [address] [place], [place], [address] Phone: [phone]   Fax:  [phone]  Physical Therapy Treatment and Recertification  Patient Details  Name: Antonio Park MRN: [id number] Date of Birth: 09-17-1935 Referring Provider (PT): Ralph Leyden MD  Progress Note Reporting Period 08/18/20  to 09/15/20  See note below for Objective Data and Assessment of Progress/Goals.      Encounter Date: 09/15/2020   PT End of Session - 09/15/20 1312    Visit Number 12    Number of Visits 30    Date for PT Re-Evaluation 10/13/20    Authorization Type BCBS no VL or auth. Recert on 3/79/02    Progress Note Due on Visit 22    PT Start Time 1300    PT Stop Time 1345    PT Time Calculation (min) 45 min    Equipment Utilized During Treatment Gait belt    Activity Tolerance Patient limited by fatigue;Patient tolerated treatment well;Patient limited by pain    Behavior During Therapy [facility] for tasks assessed/performed           Past Medical History:  Diagnosis Date  . Cancer [facility])    prostate  . Enlarged prostate   . Hypertension   . Stroke [facility])    TIA    No past surgical history on file.  There were no vitals filed for this visit.   Subjective Assessment - 09/15/20 1404    Subjective Pt reports continued difficulty with walking and notes increase in BLE edema    Currently in Pain? No/denies   notes his legs feel tight from fluid   Pain Score 0-No pain              [facility] PT Assessment - 09/15/20 0001      Assessment   Medical Diagnosis parkinsonism    Referring Provider (PT) Ralph Leyden MD      Precautions   Precautions Fall      Observation/Other Assessments-Edema    Edema --   BLE pitting edema 2 to 3+                        [facility] Adult PT Treatment/Exercise - 09/15/20 0001      Transfers   Transfers Sit to Stand    Sit to Stand 4: Min guard;3: Mod assist    Comments car transfer with  mod to max assist with legs. Improved sit to stand at end of session requiring tactile cues and verbal cues to "jump" when initiating sit to stand      Ambulation/Gait   Ambulation/Gait Yes    Ambulation/Gait Assistance 4: Min guard    Ambulation Distance (Feet) 150 Feet    Assistive device 4-wheeled walker    Gait Pattern Festinating   improved fluidity at end of session following neuro re-ed. Freezing when negotiating turns     Neuro Re-ed    Neuro Re-ed Details  Pt engaged in techniuqes to promote postural re-education and improve thorcic extension whilst performing large amplitude UE movements. Techniques to improve BLE rapid, alternating movements to improve gait quality and prevent festination.      Knee/Hip Exercises: Aerobic   Nustep level 4 x 8 min with goal of 70-80 SPM. With therapist assisting velocity 25% of the time                  PT Education - 09/15/20 1405    Education Details pt  education in benefits of large amplitude movements and performing assisted rapidly alternating movements    Person(s) Educated Patient;Spouse    Methods Explanation;Demonstration    Comprehension Verbalized understanding;Returned demonstration            PT Short Term Goals - 09/15/20 1448      PT SHORT TERM GOAL #1   Title Patient will report at least 25% improvement in overall symptoms and/or function to demonstrate improved functional mobility    Baseline 10%    Time 3    Period Weeks    Status On-going    Target Date 08/12/20      PT SHORT TERM GOAL #2   Title Patient will be able to stand at counter with one upper extremity support and SBA to demonstrate improved standing balance    Baseline CGA-mod A depending on level of activity    Time 3    Period Weeks    Status On-going    Target Date 08/12/20      PT SHORT TERM GOAL #3   Title Patient and caregiver will be independent in self management strategies to improve quality of life and functional outcomes.    Time  3    Period Weeks    Status Achieved    Target Date 08/12/20             PT Long Term Goals - 09/15/20 1449      PT LONG TERM GOAL #1   Title Patient will be able to transition from sit to stand with verbal cues and min to moderate assist to demosntrate improved transitional mobility    Baseline CGA-mod A    Time 6    Period Weeks    Status Achieved      PT LONG TERM GOAL #2   Title Patient will be able to stand with min assist without upper extremity support to demonstrate improved standing balance    Time 6    Period Weeks    Status On-going      PT LONG TERM GOAL #3   Title Patient and wife will report that patient is regularly trying to stand up from lift chair instead of getting +2 support at home    Time 6    Period Weeks    Status On-going                 Plan - 09/15/20 1445    Clinical Impression Statement Pt responding well to tx interventions and demonstrates improved BLE coordination and gait pattern following neuro re-ed activities and demonstrates no instances of festinating at end of session with improve upright posture and able to progress to transfer sit to stand with CGA vs mod A.  Continued POC indicated to promote retention of strategies and train/instruct caregiver in techniuqes to facilitate patterned and coordinated mobility to reduce risk for falls and level of assistance    Personal Factors and Comorbidities Comorbidity 1    Comorbidities HTN, CVA, parkinson's    Examination-Activity Limitations Bed Mobility;Stand;Locomotion Level    Stability/Clinical Decision Making Evolving/Moderate complexity    Rehab Potential Fair    PT Frequency 3x / week    PT Duration 6 weeks    PT Treatment/Interventions ADLs/Self Care Home Management;Aquatic Therapy;Balance training;Therapeutic exercise;Therapeutic activities;Functional mobility training;Stair training;Gait training;Neuromuscular re-education;Patient/family education;Manual techniques;Dry  needling;Passive range of motion    PT Next Visit Plan BIG movements, Continue exercises focusing on posture exercises as well as bed mobility then progess to sit to  stand followed by   Standing balance and progress to NBOS for balance.  Use BIG and POWER philosophy throughout treatment.    PT Home Exercise Plan standing at counter, STS from lift chair; 2/4: seated X to V big UE movements. 2/14  over head stretches; 2/18: bridge and bed mobility (log rolling) 2/21 bridge, SLR, LTR    Consulted and Agree with Plan of Care Patient;Family member/caregiver    Family Member Consulted Rod Holler - Wife           Patient will benefit from skilled therapeutic intervention in order to improve the following deficits and impairments:  Pain,Abnormal gait,Decreased endurance,Increased edema,Decreased knowledge of precautions,Decreased knowledge of use of DME,Decreased activity tolerance,Decreased balance,Decreased mobility,Difficulty walking,Decreased strength,Decreased range of motion,Postural dysfunction  Visit Diagnosis: Difficulty in walking, not elsewhere classified  Balance problem  History of falling     Problem List There are no problems to display for this patient.  2:51 PM, 09/15/20 M. Sherlyn Lees, PT, DPT Physical Therapist- [place] Office Number: [phone]  [facility] [address] [place], [place], [address] Phone: [phone]   Fax:  [phone]  Name: Antonio Park MRN: [id number] Date of Birth: 01/10/1936

## 2020-09-17 ENCOUNTER — Other Ambulatory Visit: Payer: Self-pay

## 2020-09-17 ENCOUNTER — Ambulatory Visit (HOSPITAL_COMMUNITY): Payer: Medicare Other | Admitting: Physical Therapy

## 2020-09-17 DIAGNOSIS — R262 Difficulty in walking, not elsewhere classified: Secondary | ICD-10-CM

## 2020-09-17 DIAGNOSIS — R2689 Other abnormalities of gait and mobility: Secondary | ICD-10-CM

## 2020-09-17 DIAGNOSIS — Z9181 History of falling: Secondary | ICD-10-CM

## 2020-09-17 NOTE — Therapy (Signed)
[place], [place], [address] Phone: [phone]   Fax:  [phone]  Physical Therapy Treatment  Patient Details  Name: Antonio Park MRN: [id number] Date of Birth: 01-08-1936 Referring Provider (PT): Ralph Leyden MD   Encounter Date: 09/17/2020   PT End of Session - 09/17/20 1409    Visit Number 13    Number of Visits 30    Date for PT Re-Evaluation 09/02/20    Authorization Type BCBS no VL or auth. Recert on 3/81/01    Progress Note Due on Visit 22    PT Start Time 1320    PT Stop Time 1408    PT Time Calculation (min) 48 min    Equipment Utilized During Treatment Gait belt    Activity Tolerance Patient limited by fatigue;Patient tolerated treatment well;Patient limited by pain    Behavior During Therapy WFL for tasks assessed/performed           Past Medical History:  Diagnosis Date  . Cancer [facility])    prostate  . Enlarged prostate   . Hypertension   . Stroke [facility])    TIA    No past surgical history on file.  There were no vitals filed for this visit.   Subjective Assessment - 09/17/20 1327    Subjective pt and spouse states he's still having thigh edema and is wearing his knee highs; mentioned trial of thigh highs.    Currently in Pain? No/denies                             [facility] Adult PT Treatment/Exercise - 09/17/20 0001      Transfers   Transfers Sit to Stand    Sit to Stand 4: Min guard;3: Mod assist    Comments car transfer with mod to max assist with legs. Improved sit to stand at end of session requiring tactile cues and verbal cues to "jump" when initiating sit to stand      Knee/Hip Exercises: Aerobic   Nustep level 3 x 5 min with goal of 70-80 SPM.      Knee/Hip Exercises: Standing   Hip Flexion 10 reps    Hip Flexion Limitations alternating march    Hip Abduction Both;10 reps;2 sets    Abduction Limitations postural cues    Hip Extension Both;10 reps     Extension Limitations 2 sets postural cueing                    PT Short Term Goals - 09/15/20 1448      PT SHORT TERM GOAL #1   Title Patient will report at least 25% improvement in overall symptoms and/or function to demonstrate improved functional mobility    Baseline 10%    Time 3    Period Weeks    Status On-going    Target Date 08/12/20      PT SHORT TERM GOAL #2   Title Patient will be able to stand at counter with one upper extremity support and SBA to demonstrate improved standing balance    Baseline CGA-mod A depending on level of activity    Time 3    Period Weeks    Status On-going    Target Date 08/12/20      PT SHORT TERM GOAL #3   Title Patient and caregiver will be independent in self management strategies to improve quality of life and functional outcomes.  Time 3    Period Weeks    Status Achieved    Target Date 08/12/20             PT Long Term Goals - 09/15/20 1449      PT LONG TERM GOAL #1   Title Patient will be able to transition from sit to stand with verbal cues and min to moderate assist to demosntrate improved transitional mobility    Baseline CGA-mod A    Time 6    Period Weeks    Status Achieved      PT LONG TERM GOAL #2   Title Patient will be able to stand with min assist without upper extremity support to demonstrate improved standing balance    Time 4    Period Weeks    Status On-going    Target Date 10/13/20      PT LONG TERM GOAL #3   Title Patient and wife will report that patient is regularly trying to stand up from lift chair instead of getting +2 support at home    Time 4    Period Weeks    Status On-going    Target Date 10/13/20                 Plan - 09/17/20 1410    Clinical Impression Statement Pt with extended time needed to complete functional activities due to need for rest breaks and slow assisted movements to accomplish tasks.  Pt with noted dependency on wife for general transfers to  "pull" him up or lift his feet up.  Educated both pateint and spouse that he needs to complete these tasks himself even if it takes increased time.  Pt with cues to shift weight forward with standing as pushes weight back.  spouse initiates foot first car transfer by picking up pt's LE and placing onto step bar rather than backing into seat.  Educated on safe way to complete transfer.  Pt required 4 seated rest breaks this session.    Personal Factors and Comorbidities Comorbidity 1    Comorbidities HTN, CVA, parkinson's    Examination-Activity Limitations Bed Mobility;Stand;Locomotion Level    Stability/Clinical Decision Making Evolving/Moderate complexity    Rehab Potential Fair    PT Frequency 3x / week    PT Duration 6 weeks    PT Treatment/Interventions ADLs/Self Care Home Management;Aquatic Therapy;Balance training;Therapeutic exercise;Therapeutic activities;Functional mobility training;Stair training;Gait training;Neuromuscular re-education;Patient/family education;Manual techniques;Dry needling;Passive range of motion    PT Next Visit Plan BIG movements, Continue exercises focusing on posture exercises as well as bed mobility then progess to sit to stand followed by   Standing balance and progress to NBOS for balance.  Use BIG and POWER philosophy throughout treatment.  Continue to educate on safe car transfer.    PT Home Exercise Plan standing at counter, STS from lift chair; 2/4: seated X to V big UE movements. 2/14  over head stretches; 2/18: bridge and bed mobility (log rolling) 2/21 bridge, SLR, LTR    Consulted and Agree with Plan of Care Patient;Family member/caregiver    Family Member Consulted Rod Holler - Wife           Patient will benefit from skilled therapeutic intervention in order to improve the following deficits and impairments:  Pain,Abnormal gait,Decreased endurance,Increased edema,Decreased knowledge of precautions,Decreased knowledge of use of DME,Decreased activity  tolerance,Decreased balance,Decreased mobility,Difficulty walking,Decreased strength,Decreased range of motion,Postural dysfunction  Visit Diagnosis: Difficulty in walking, not elsewhere classified  Balance problem  History of falling  Problem List There are no problems to display for this patient.  Teena Irani, PTA/CLT [phone]  Teena Irani 09/17/2020, 2:15 PM  [place] [address] [place], [place], [address] Phone: [phone]   Fax:  [phone]  Name: Antonio Park MRN: [id number] Date of Birth: 1935/07/18

## 2020-09-19 ENCOUNTER — Ambulatory Visit (HOSPITAL_COMMUNITY): Payer: BLUE CROSS/BLUE SHIELD

## 2020-09-23 ENCOUNTER — Ambulatory Visit (HOSPITAL_COMMUNITY): Payer: BLUE CROSS/BLUE SHIELD

## 2020-09-26 ENCOUNTER — Ambulatory Visit (HOSPITAL_COMMUNITY): Payer: Medicare Other | Attending: Internal Medicine

## 2020-09-26 DIAGNOSIS — Z9181 History of falling: Secondary | ICD-10-CM | POA: Insufficient documentation

## 2020-09-26 DIAGNOSIS — R262 Difficulty in walking, not elsewhere classified: Secondary | ICD-10-CM | POA: Insufficient documentation

## 2020-09-26 DIAGNOSIS — R2689 Other abnormalities of gait and mobility: Secondary | ICD-10-CM | POA: Insufficient documentation

## 2020-09-30 ENCOUNTER — Other Ambulatory Visit: Payer: Self-pay

## 2020-09-30 ENCOUNTER — Ambulatory Visit (HOSPITAL_COMMUNITY): Payer: Medicare Other

## 2020-09-30 DIAGNOSIS — Z9181 History of falling: Secondary | ICD-10-CM

## 2020-09-30 DIAGNOSIS — R2689 Other abnormalities of gait and mobility: Secondary | ICD-10-CM | POA: Diagnosis present

## 2020-09-30 DIAGNOSIS — R262 Difficulty in walking, not elsewhere classified: Secondary | ICD-10-CM

## 2020-09-30 NOTE — Therapy (Signed)
[place], [place], [address] Phone: [phone]   Fax:  [phone]  Physical Therapy Treatment  Patient Details  Name: Antonio Park MRN: [id number] Date of Birth: 05/20/1936 Referring Provider (PT): Ralph Leyden MD   Encounter Date: 09/30/2020   PT End of Session - 09/30/20 1609    Visit Number 14    Number of Visits 30    Date for PT Re-Evaluation 10/13/20    Authorization Type BCBS no VL or auth. Recert on 0/62/69    Progress Note Due on Visit 22    PT Start Time 1555    PT Stop Time 1645    PT Time Calculation (min) 50 min    Equipment Utilized During Treatment Gait belt    Activity Tolerance Patient limited by fatigue;Patient tolerated treatment well;Patient limited by pain    Behavior During Therapy WFL for tasks assessed/performed           Past Medical History:  Diagnosis Date  . Cancer [facility])    prostate  . Enlarged prostate   . Hypertension   . Stroke [facility])    TIA    No past surgical history on file.  There were no vitals filed for this visit.   Subjective Assessment - 09/30/20 1609    Subjective Reports he is feeling better and has been able to wear compression stockings which has helped his LE edema              [facility] PT Assessment - 09/30/20 0001      Assessment   Medical Diagnosis parkinsonism    Referring Provider (PT) Ralph Leyden MD                         [facility] Adult PT Treatment/Exercise - 09/30/20 0001      Transfers   Transfers Sit to Stand    Sit to Stand 4: Min guard;3: Mod assist    Comments car transfer with mod to max assist with legs. Improved sit to stand at end of session requiring tactile cues and verbal cues to "jump" when initiating sit to stand      Ambulation/Gait   Ambulation/Gait Yes    Ambulation/Gait Assistance 4: Min guard    Ambulation Distance (Feet) 150 Feet    Assistive device Rolling walker    Gait Pattern --    Freezing of Gait   Gait Comments techniuqes to improve turning and to reduce freezing of gait with improved fluidity by end of session and using cues of "turning your feet to...(clock position)      Neuro Re-ed    Neuro Re-ed Details  Seated postural re-education to improve thoracic extension and head-up posture with tactile cues and performing head turns and roll down length of spine for extension cue with good carryover. Standing agsint wall and performing trunk extensions for upright posture and 3x10 green t-band rows. Techniuqes to improve rapid alternating movements BUE and BLE to improve coordination and facilitate motor control      Knee/Hip Exercises: Aerobic   Nustep level 1 x 6 min iwth goal of > 70 SPM                    PT Short Term Goals - 09/15/20 1448      PT SHORT TERM GOAL #1   Title Patient will report at least 25% improvement in overall symptoms and/or function to demonstrate improved functional  mobility    Baseline 10%    Time 3    Period Weeks    Status On-going    Target Date 08/12/20      PT SHORT TERM GOAL #2   Title Patient will be able to stand at counter with one upper extremity support and SBA to demonstrate improved standing balance    Baseline CGA-mod A depending on level of activity    Time 3    Period Weeks    Status On-going    Target Date 08/12/20      PT SHORT TERM GOAL #3   Title Patient and caregiver will be independent in self management strategies to improve quality of life and functional outcomes.    Time 3    Period Weeks    Status Achieved    Target Date 08/12/20             PT Long Term Goals - 09/15/20 1449      PT LONG TERM GOAL #1   Title Patient will be able to transition from sit to stand with verbal cues and min to moderate assist to demosntrate improved transitional mobility    Baseline CGA-mod A    Time 6    Period Weeks    Status Achieved      PT LONG TERM GOAL #2   Title Patient will be able to stand  with min assist without upper extremity support to demonstrate improved standing balance    Time 4    Period Weeks    Status On-going    Target Date 10/13/20      PT LONG TERM GOAL #3   Title Patient and wife will report that patient is regularly trying to stand up from lift chair instead of getting +2 support at home    Time 4    Period Weeks    Status On-going    Target Date 10/13/20                 Plan - 09/30/20 1738    Clinical Impression Statement Tolerating tx sessions well and demonstrating improved upright posture following techniuqes and training but unable to maintain postural correction once ambulating or performing dynamic reaching with forward trunk flexion but good ability to maintain static balance with less cues required. Decreased freezing of gait using visual cue of turning feet to clock positions which decreased festinating and freezing during turns 25% of the time    Personal Factors and Comorbidities Comorbidity 1    Comorbidities HTN, CVA, parkinson's    Examination-Activity Limitations Bed Mobility;Stand;Locomotion Level    Stability/Clinical Decision Making Evolving/Moderate complexity    Rehab Potential Fair    PT Frequency 3x / week    PT Duration 6 weeks    PT Treatment/Interventions ADLs/Self Care Home Management;Aquatic Therapy;Balance training;Therapeutic exercise;Therapeutic activities;Functional mobility training;Stair training;Gait training;Neuromuscular re-education;Patient/family education;Manual techniques;Dry needling;Passive range of motion    PT Next Visit Plan BIG movements, Continue exercises focusing on posture exercises as well as bed mobility then progess to sit to stand followed by   Standing balance and progress to NBOS for balance.  Use BIG and POWER philosophy throughout treatment.  Continue to educate on safe car transfer.    PT Home Exercise Plan standing at counter, STS from lift chair; 2/4: seated X to V big UE movements. 2/14   over head stretches; 2/18: bridge and bed mobility (log rolling) 2/21 bridge, SLR, LTR    Consulted and Agree with Plan of Care  Patient;Family member/caregiver    Family Member Consulted Rod Holler - Wife           Patient will benefit from skilled therapeutic intervention in order to improve the following deficits and impairments:  Pain,Abnormal gait,Decreased endurance,Increased edema,Decreased knowledge of precautions,Decreased knowledge of use of DME,Decreased activity tolerance,Decreased balance,Decreased mobility,Difficulty walking,Decreased strength,Decreased range of motion,Postural dysfunction  Visit Diagnosis: Difficulty in walking, not elsewhere classified  Balance problem  History of falling     Problem List There are no problems to display for this patient.  5:44 PM, 09/30/20 M. Sherlyn Lees, PT, DPT Physical Therapist- [place] Office Number: [phone]  [facility] [address] [place], [place], [address] Phone: [phone]   Fax:  [phone]  Name: Antonio Park MRN: [id number] Date of Birth: [birth year]

## 2020-10-01 ENCOUNTER — Ambulatory Visit (HOSPITAL_COMMUNITY): Payer: Medicare Other | Admitting: Physical Therapy

## 2020-10-01 ENCOUNTER — Telehealth (HOSPITAL_COMMUNITY): Payer: Self-pay | Admitting: Physical Therapy

## 2020-10-01 NOTE — Telephone Encounter (Signed)
Pt did not show for appt.  Spoke to CG who states she meant to call and cancel due to another MD appointment today but then forgot.  Reminded CG that patient has his next appointment scheduled for Monday of next week.   Teena Irani, PTA/CLT [phone]

## 2020-10-06 ENCOUNTER — Ambulatory Visit (HOSPITAL_COMMUNITY): Payer: Medicare Other

## 2020-10-06 ENCOUNTER — Other Ambulatory Visit: Payer: Self-pay

## 2020-10-06 ENCOUNTER — Encounter (HOSPITAL_COMMUNITY): Payer: Self-pay

## 2020-10-06 DIAGNOSIS — R262 Difficulty in walking, not elsewhere classified: Secondary | ICD-10-CM | POA: Diagnosis not present

## 2020-10-06 DIAGNOSIS — R2689 Other abnormalities of gait and mobility: Secondary | ICD-10-CM

## 2020-10-06 DIAGNOSIS — Z9181 History of falling: Secondary | ICD-10-CM

## 2020-10-06 NOTE — Therapy (Signed)
[place], [place], [address] Phone: [phone]   Fax:  [phone]  Physical Therapy Treatment  Patient Details  Name: Antonio Park MRN: [id number] Date of Birth: [birth year] Referring Provider (PT): Ralph Leyden MD   Encounter Date: 10/06/2020   PT End of Session - 10/06/20 1421    Visit Number 15    Number of Visits 30    Date for PT Re-Evaluation 10/13/20    Authorization Type BCBS no VL or auth. Recert on 10/02/22    Progress Note Due on Visit 22    PT Start Time 1345    PT Stop Time 1430    PT Time Calculation (min) 45 min    Equipment Utilized During Treatment Gait belt    Activity Tolerance Patient limited by fatigue;Patient tolerated treatment well;Patient limited by pain    Behavior During Therapy WFL for tasks assessed/performed           Past Medical History:  Diagnosis Date  . Cancer [facility])    prostate  . Enlarged prostate   . Hypertension   . Stroke [facility])    TIA    History reviewed. No pertinent surgical history.  There were no vitals filed for this visit.   Subjective Assessment - 10/06/20 1420    Subjective Pt reports he feels fine and is motivated to improve his walking and balance and ability to transfer without assistance    Currently in Pain? No/denies    Pain Score 0-No pain                             [facility] Adult PT Treatment/Exercise - 10/06/20 0001      Ambulation/Gait   Ambulation/Gait Yes    Ambulation/Gait Assistance 4: Min guard    Assistive device Body weight support system;Rolling walker    Gait Comments gait training with body-weight support and trials of stepping over obstacles 3x 8 ft and visual cues for improving turning to reduce instances of freezing using visual targets for feet. Stair taps with 5 lbs ankle weights 2x2 min on 4" step. Forward/retrowalking with 5 lbs ankle weights 5x8 ft requiring tactile cues for hip extension       Knee/Hip Exercises: Aerobic   Nustep level 4 x 7 min with goal of 75 SPM maintained with occasoinal verbal cues                  PT Education - 10/06/20 1429    Education Details pt education in tx rationale and facilitation of techniuqes for "big and loud" to improve amplitude of movement    Person(s) Educated Patient    Methods Explanation;Demonstration    Comprehension Verbalized understanding;Need further instruction            PT Short Term Goals - 09/15/20 1448      PT SHORT TERM GOAL #1   Title Patient will report at least 25% improvement in overall symptoms and/or function to demonstrate improved functional mobility    Baseline 10%    Time 3    Period Weeks    Status On-going    Target Date 08/12/20      PT SHORT TERM GOAL #2   Title Patient will be able to stand at counter with one upper extremity support and SBA to demonstrate improved standing balance    Baseline CGA-mod A depending on level of activity    Time 3  Period Weeks    Status On-going    Target Date 08/12/20      PT SHORT TERM GOAL #3   Title Patient and caregiver will be independent in self management strategies to improve quality of life and functional outcomes.    Time 3    Period Weeks    Status Achieved    Target Date 08/12/20             PT Long Term Goals - 09/15/20 1449      PT LONG TERM GOAL #1   Title Patient will be able to transition from sit to stand with verbal cues and min to moderate assist to demosntrate improved transitional mobility    Baseline CGA-mod A    Time 6    Period Weeks    Status Achieved      PT LONG TERM GOAL #2   Title Patient will be able to stand with min assist without upper extremity support to demonstrate improved standing balance    Time 4    Period Weeks    Status On-going    Target Date 10/13/20      PT LONG TERM GOAL #3   Title Patient and wife will report that patient is regularly trying to stand up from lift chair instead of  getting +2 support at home    Time 4    Period Weeks    Status On-going    Target Date 10/13/20                 Plan - 10/06/20 1430    Clinical Impression Statement Responding very well to gait training strategies to improve step height and with improved carryover using visual cues for foot placement to reduce freezing of gait. Difficulty with sequencing and maintaining movement when retrowalking requiring consistent tactile cues to facilitate hip extension. Improved fluidity and step height appreciated at end of session no longer requiring verbal/tactile cues to clear obstacles when walking to waiting room and exiting building.  Continued POC indicated to improve motor control and facilitate carryover with caregiver for mobility techniques    Personal Factors and Comorbidities Comorbidity 1    Comorbidities HTN, CVA, parkinson's    Examination-Activity Limitations Bed Mobility;Stand;Locomotion Level    Stability/Clinical Decision Making Evolving/Moderate complexity    Rehab Potential Fair    PT Frequency 3x / week    PT Duration 6 weeks    PT Treatment/Interventions ADLs/Self Care Home Management;Aquatic Therapy;Balance training;Therapeutic exercise;Therapeutic activities;Functional mobility training;Stair training;Gait training;Neuromuscular re-education;Patient/family education;Manual techniques;Dry needling;Passive range of motion    PT Next Visit Plan BIG movements, Continue exercises focusing on posture exercises as well as bed mobility then progess to sit to stand followed by   Standing balance and progress to NBOS for balance.  Use BIG and POWER philosophy throughout treatment.  Continue to educate on safe car transfer.    PT Home Exercise Plan standing at counter, STS from lift chair; 2/4: seated X to V big UE movements. 2/14  over head stretches; 2/18: bridge and bed mobility (log rolling) 2/21 bridge, SLR, LTR    Consulted and Agree with Plan of Care Patient;Family  member/caregiver    Family Member Consulted Rod Holler - Wife           Patient will benefit from skilled therapeutic intervention in order to improve the following deficits and impairments:  Pain,Abnormal gait,Decreased endurance,Increased edema,Decreased knowledge of precautions,Decreased knowledge of use of DME,Decreased activity tolerance,Decreased balance,Decreased mobility,Difficulty walking,Decreased strength,Decreased range of motion,Postural  dysfunction  Visit Diagnosis: Difficulty in walking, not elsewhere classified  Balance problem  History of falling     Problem List There are no problems to display for this patient.  2:33 PM, 10/06/20 M. Sherlyn Lees, PT, DPT Physical Therapist- [place] Office Number: [phone]  [facility] [address] [place], [place], [address] Phone: [phone]   Fax:  [phone]  Name: Antonio Park MRN: [id number] Date of Birth: August 21, 1935

## 2020-10-08 ENCOUNTER — Other Ambulatory Visit: Payer: Self-pay

## 2020-10-08 ENCOUNTER — Ambulatory Visit (HOSPITAL_COMMUNITY): Payer: Medicare Other

## 2020-10-08 DIAGNOSIS — R262 Difficulty in walking, not elsewhere classified: Secondary | ICD-10-CM | POA: Diagnosis not present

## 2020-10-08 DIAGNOSIS — Z9181 History of falling: Secondary | ICD-10-CM

## 2020-10-08 DIAGNOSIS — R2689 Other abnormalities of gait and mobility: Secondary | ICD-10-CM

## 2020-10-08 NOTE — Therapy (Signed)
[place], [place], [address] Phone: [phone]   Fax:  [phone]  Physical Therapy Treatment  Patient Details  Name: Antonio Park MRN: [id number] Date of Birth: 07/28/1935 Referring Provider (PT): Ralph Leyden MD   Encounter Date: 10/08/2020   PT End of Session - 10/08/20 1415    Visit Number 16    Number of Visits 30    Date for PT Re-Evaluation 10/13/20    Authorization Type BCBS no VL or auth. Recert on 2/58/52    Progress Note Due on Visit 22    PT Start Time 1340    PT Stop Time 1445    PT Time Calculation (min) 65 min    Equipment Utilized During Treatment Gait belt    Activity Tolerance Patient limited by fatigue;Patient tolerated treatment well;Patient limited by pain    Behavior During Therapy WFL for tasks assessed/performed           Past Medical History:  Diagnosis Date  . Cancer [facility])    prostate  . Enlarged prostate   . Hypertension   . Stroke [facility])    TIA    No past surgical history on file.  There were no vitals filed for this visit.   Subjective Assessment - 10/08/20 1416    Subjective reports he has had a blister on the inside of right great toe from the 2nd toe rubbing. Caregiver reports they have it wrapped with gauze and a toe spacer    Patient is accompained by: Family member    Pertinent History parkinson's                             [place] Adult PT Treatment/Exercise - 10/08/20 0001      Ambulation/Gait   Ambulation/Gait Yes    Ambulation/Gait Assistance 4: Min guard    Assistive device Body weight support system;Rolling walker    Gait Comments gait training with treadmill and BWS with visual lines placed on belt to improve step height, length, and reactive stepping with good performance and limited festinating. Visual cues to perform turns to reduce freezing with 25% carryover. Curb negotiation with rollator and CGA 2x      Therapeutic  Activites    Therapeutic Activities Other Therapeutic Activities   transfer training to improve safety with car transfers at mod A and tactile cues for limb advancement     Knee/Hip Exercises: Aerobic   Nustep level 5 x 5 min for dynamic warmup and benefit of alternating movement                  PT Education - 10/08/20 1458    Education Details referenced item to spread toes to prevent rubbing    Person(s) Educated Patient;Caregiver(s)    Methods Explanation;Demonstration    Comprehension Verbalized understanding            PT Short Term Goals - 09/15/20 1448      PT SHORT TERM GOAL #1   Title Patient will report at least 25% improvement in overall symptoms and/or function to demonstrate improved functional mobility    Baseline 10%    Time 3    Period Weeks    Status On-going    Target Date 08/12/20      PT SHORT TERM GOAL #2   Title Patient will be able to stand at counter with one upper extremity support and SBA to demonstrate improved  standing balance    Baseline CGA-mod A depending on level of activity    Time 3    Period Weeks    Status On-going    Target Date 08/12/20      PT SHORT TERM GOAL #3   Title Patient and caregiver will be independent in self management strategies to improve quality of life and functional outcomes.    Time 3    Period Weeks    Status Achieved    Target Date 08/12/20             PT Long Term Goals - 09/15/20 1449      PT LONG TERM GOAL #1   Title Patient will be able to transition from sit to stand with verbal cues and min to moderate assist to demosntrate improved transitional mobility    Baseline CGA-mod A    Time 6    Period Weeks    Status Achieved      PT LONG TERM GOAL #2   Title Patient will be able to stand with min assist without upper extremity support to demonstrate improved standing balance    Time 4    Period Weeks    Status On-going    Target Date 10/13/20      PT LONG TERM GOAL #3   Title Patient  and wife will report that patient is regularly trying to stand up from lift chair instead of getting +2 support at home    Time 4    Period Weeks    Status On-going    Target Date 10/13/20                 Plan - 10/08/20 1459    Clinical Impression Statement Good performance with body weight support treadmill training performed in 2 min bouts with varying velocity of 0.5 to 0.9 mph and demonstrating good consistent limb advancement and foot clearance. Decreased festination and freezing of gait appreciated after session. Continued POC indictaed to improve gait velocity and safety and implement carryover with caregiver    Personal Factors and Comorbidities Comorbidity 1    Comorbidities HTN, CVA, parkinson's    Examination-Activity Limitations Bed Mobility;Stand;Locomotion Level    Stability/Clinical Decision Making Evolving/Moderate complexity    Rehab Potential Fair    PT Frequency 3x / week    PT Duration 6 weeks    PT Treatment/Interventions ADLs/Self Care Home Management;Aquatic Therapy;Balance training;Therapeutic exercise;Therapeutic activities;Functional mobility training;Stair training;Gait training;Neuromuscular re-education;Patient/family education;Manual techniques;Dry needling;Passive range of motion    PT Next Visit Plan BIG movements, Continue exercises focusing on posture exercises as well as bed mobility then progess to sit to stand followed by   Standing balance and progress to NBOS for balance.  Use BIG and POWER philosophy throughout treatment.  Continue to educate on safe car transfer.    PT Home Exercise Plan standing at counter, STS from lift chair; 2/4: seated X to V big UE movements. 2/14  over head stretches; 2/18: bridge and bed mobility (log rolling) 2/21 bridge, SLR, LTR    Consulted and Agree with Plan of Care Patient;Family member/caregiver    Family Member Consulted Rod Holler - Wife           Patient will benefit from skilled therapeutic intervention in  order to improve the following deficits and impairments:  Pain,Abnormal gait,Decreased endurance,Increased edema,Decreased knowledge of precautions,Decreased knowledge of use of DME,Decreased activity tolerance,Decreased balance,Decreased mobility,Difficulty walking,Decreased strength,Decreased range of motion,Postural dysfunction  Visit Diagnosis: Difficulty in walking, not elsewhere classified  Balance problem  History of falling     Problem List There are no problems to display for this patient.  3:04 PM, 10/08/20 M. Sherlyn Lees, PT, DPT Physical Therapist- [place] Office Number: [phone]  [place] [address] [place], [place], [address] Phone: [phone]   Fax:  [phone]  Name: Antonio Park MRN: [id number] Date of Birth: 1936-04-21

## 2020-10-10 ENCOUNTER — Other Ambulatory Visit: Payer: Self-pay

## 2020-10-10 ENCOUNTER — Ambulatory Visit (HOSPITAL_COMMUNITY): Payer: Medicare Other

## 2020-10-10 DIAGNOSIS — R262 Difficulty in walking, not elsewhere classified: Secondary | ICD-10-CM

## 2020-10-10 DIAGNOSIS — R2689 Other abnormalities of gait and mobility: Secondary | ICD-10-CM

## 2020-10-10 DIAGNOSIS — Z9181 History of falling: Secondary | ICD-10-CM

## 2020-10-10 NOTE — Therapy (Signed)
[place], [place], [address] Phone: [phone]   Fax:  [phone]  Physical Therapy Treatment  Patient Details  Name: Antonio Park MRN: [id number] Date of Birth: 1936/02/09 Referring Provider (PT): Ralph Leyden MD   Encounter Date: 10/10/2020   PT End of Session - 10/10/20 1400    Visit Number 17    Number of Visits 30    Date for PT Re-Evaluation 10/13/20    Authorization Type BCBS no VL or auth. Recert on 1/82/99    Progress Note Due on Visit 22    PT Start Time 1400    PT Stop Time 1455    PT Time Calculation (min) 55 min    Equipment Utilized During Treatment Gait belt    Activity Tolerance Patient tolerated treatment well    Behavior During Therapy WFL for tasks assessed/performed           Past Medical History:  Diagnosis Date  . Cancer [facility])    prostate  . Enlarged prostate   . Hypertension   . Stroke [facility])    TIA    No past surgical history on file.  There were no vitals filed for this visit.   Subjective Assessment - 10/10/20 1408    Subjective "I want to get my legs and arms stronger"    Patient is accompained by: Family member    Pertinent History parkinson's    Currently in Pain? No/denies    Pain Score 0-No pain              [facility] PT Assessment - 10/10/20 0001      Assessment   Medical Diagnosis parkinsonism    Referring Provider (PT) Ralph Leyden MD                         [facility] Adult PT Treatment/Exercise - 10/10/20 0001      Ambulation/Gait   Ambulation/Gait Yes    Ambulation/Gait Assistance 5: Supervision    Ambulation Distance (Feet) 150 Feet    Gait Comments gait training with cues for big steps and clearing obstacles and curb negotiation. Instances of festinating with tactile cues to advance limb with good carryover following cue      Neuro Re-ed    Neuro Re-ed Details  standing postural re-education performed with dynadisc against  wall and therapist giving support for lumbar extension whilst pt performs 10 lbs hammer curls 3x10 reps followed by static stretch into extension      Knee/Hip Exercises: Aerobic   Nustep level 5 x 5 min for dynamic warmup and benefit of alternating movement      Knee/Hip Exercises: Machines for Strengthening   Cybex Knee Flexion 4 plates. 3x10      Knee/Hip Exercises: Standing   Hip Flexion Stengthening;Both;3 sets;10 reps    Hip Flexion Limitations 5 lbs ankle weights with belt across bar for visual target      Knee/Hip Exercises: Seated   Other Seated Knee/Hip Exercises seated rows with tower at 5 plates 3x10. hi-pull row 3 plates, 2x10                    PT Short Term Goals - 09/15/20 1448      PT SHORT TERM GOAL #1   Title Patient will report at least 25% improvement in overall symptoms and/or function to demonstrate improved functional mobility    Baseline 10%    Time 3  Period Weeks    Status On-going    Target Date 08/12/20      PT SHORT TERM GOAL #2   Title Patient will be able to stand at counter with one upper extremity support and SBA to demonstrate improved standing balance    Baseline CGA-mod A depending on level of activity    Time 3    Period Weeks    Status On-going    Target Date 08/12/20      PT SHORT TERM GOAL #3   Title Patient and caregiver will be independent in self management strategies to improve quality of life and functional outcomes.    Time 3    Period Weeks    Status Achieved    Target Date 08/12/20             PT Long Term Goals - 09/15/20 1449      PT LONG TERM GOAL #1   Title Patient will be able to transition from sit to stand with verbal cues and min to moderate assist to demosntrate improved transitional mobility    Baseline CGA-mod A    Time 6    Period Weeks    Status Achieved      PT LONG TERM GOAL #2   Title Patient will be able to stand with min assist without upper extremity support to demonstrate improved  standing balance    Time 4    Period Weeks    Status On-going    Target Date 10/13/20      PT LONG TERM GOAL #3   Title Patient and wife will report that patient is regularly trying to stand up from lift chair instead of getting +2 support at home    Time 4    Period Weeks    Status On-going    Target Date 10/13/20                 Plan - 10/10/20 1500    Clinical Impression Statement Pt tolerating tx sessions well and demo improved activity tolerance as evidenced by decreased need for seated rest periods and progressing with PRE without adverse effects and demo improved fluidity of movement with heavy resistance exercises. Continued POC indicated to provide continued gait training strategies to decrease freezing of gait and limit instances of festination. Continued PRE indicated to improve muscular coordination and function to increase activity tolerance    Personal Factors and Comorbidities Comorbidity 1    Comorbidities HTN, CVA, parkinson's    Examination-Activity Limitations Bed Mobility;Stand;Locomotion Level    Stability/Clinical Decision Making Evolving/Moderate complexity    Rehab Potential Fair    PT Frequency 3x / week    PT Duration 6 weeks    PT Treatment/Interventions ADLs/Self Care Home Management;Aquatic Therapy;Balance training;Therapeutic exercise;Therapeutic activities;Functional mobility training;Stair training;Gait training;Neuromuscular re-education;Patient/family education;Manual techniques;Dry needling;Passive range of motion    PT Next Visit Plan mat table LE PRE, dots on floor for stepping strategy to perform turns    PT Home Exercise Plan standing at counter, STS from lift chair; 2/4: seated X to V big UE movements. 2/14  over head stretches; 2/18: bridge and bed mobility (log rolling) 2/21 bridge, SLR, LTR    Consulted and Agree with Plan of Care Patient;Family member/caregiver    Family Member Consulted Rod Holler - Wife           Patient will benefit  from skilled therapeutic intervention in order to improve the following deficits and impairments:  Pain,Abnormal gait,Decreased endurance,Increased edema,Decreased knowledge of  precautions,Decreased knowledge of use of DME,Decreased activity tolerance,Decreased balance,Decreased mobility,Difficulty walking,Decreased strength,Decreased range of motion,Postural dysfunction  Visit Diagnosis: Difficulty in walking, not elsewhere classified  Balance problem  History of falling     Problem List There are no problems to display for this patient.  3:05 PM, 10/10/20 M. Sherlyn Lees, PT, DPT Physical Therapist- [place] Office Number: [phone]  [place] [address] [place], [place], [address] Phone: [phone]   Fax:  [phone]  Name: Antonio Park MRN: [id number] Date of Birth: 10/22/1935

## 2020-10-13 ENCOUNTER — Ambulatory Visit (HOSPITAL_COMMUNITY): Payer: Medicare Other

## 2020-10-13 ENCOUNTER — Other Ambulatory Visit: Payer: Self-pay

## 2020-10-13 DIAGNOSIS — Z9181 History of falling: Secondary | ICD-10-CM

## 2020-10-13 DIAGNOSIS — R2689 Other abnormalities of gait and mobility: Secondary | ICD-10-CM

## 2020-10-13 DIAGNOSIS — R262 Difficulty in walking, not elsewhere classified: Secondary | ICD-10-CM | POA: Diagnosis not present

## 2020-10-13 NOTE — Therapy (Signed)
[place], [place], [address] Phone: [phone]   Fax:  [phone]  Physical Therapy Treatment and recertification  Patient Details  Name: Antonio Park MRN: [id number] Date of Birth: [birth year] Referring Provider (PT): Ralph Leyden MD   Encounter Date: 10/13/2020   PT End of Session - 10/13/20 1447    Visit Number 18    Number of Visits 30    Date for PT Re-Evaluation 11/10/20    Authorization Type BCBS no VL or auth. Recert on 06/29/30    Progress Note Due on Visit 22    PT Start Time 1355    PT Stop Time 1440    PT Time Calculation (min) 45 min    Equipment Utilized During Treatment Gait belt    Activity Tolerance Patient tolerated treatment well    Behavior During Therapy WFL for tasks assessed/performed           Past Medical History:  Diagnosis Date  . Cancer [facility])    prostate  . Enlarged prostate   . Hypertension   . Stroke [facility])    TIA    No past surgical history on file.  There were no vitals filed for this visit.   Subjective Assessment - 10/13/20 1447    Subjective Pt reports pain and sore on 2nd toe of right foot. Therapist inspects foot which reveals ulcer on distal, plantar surface of 2nd right toe which measures 43mmx7mm    Patient is accompained by: Family member    Pertinent History parkinson's              [facility] PT Assessment - 10/13/20 0001      Assessment   Medical Diagnosis parkinsonism    Referring Provider (PT) Ralph Leyden MD                         [facility] Adult PT Treatment/Exercise - 10/13/20 0001      Transfers   Transfers Sit to Stand    Sit to Stand 4: Min guard;3: Mod assist    Comments car transfer with mod to max assist with legs. Improved sit to stand at end of session requiring tactile cues and verbal cues to "jump" when initiating sit to stand      Ambulation/Gait   Ambulation/Gait Yes    Ambulation/Gait Assistance 5:  Supervision    Pre-Gait Activities activity to decrease freezing of gait during turns using colored dots on floor for visual guide with improved response                  PT Education - 10/13/20 1448    Education Details discussion with pt and caregiver regarding cleaning toe with antibacterial soap and dressing with gauze and advised to seek MD visit if not improved. Therapist dressed toe with xeroform strip and gauze wrap    Person(s) Educated Patient;Spouse    Methods Explanation;Demonstration    Comprehension Verbalized understanding            PT Short Term Goals - 10/13/20 1456      PT SHORT TERM GOAL #1   Title Patient will report at least 25% improvement in overall symptoms and/or function to demonstrate improved functional mobility    Baseline 10%    Time 2    Period Weeks    Status On-going    Target Date 10/27/20      PT SHORT TERM GOAL #2   Title  Patient will be able to stand at counter with one upper extremity support and SBA to demonstrate improved standing balance    Baseline CGA-mod A depending on level of activity    Time 2    Period Weeks    Status On-going    Target Date 10/27/20      PT SHORT TERM GOAL #3   Title Patient and caregiver will be independent in self management strategies to improve quality of life and functional outcomes.    Time 2    Period Weeks    Status Achieved    Target Date 10/27/20             PT Long Term Goals - 10/13/20 1457      PT LONG TERM GOAL #1   Title Patient will be able to transition from sit to stand with verbal cues and min to moderate assist to demosntrate improved transitional mobility    Baseline CGA-mod A    Time 4    Period Weeks    Status Achieved    Target Date 11/10/20      PT LONG TERM GOAL #2   Title Patient will be able to stand with min assist without upper extremity support to demonstrate improved standing balance    Time 4    Period Weeks    Status On-going    Target Date 11/10/20       PT LONG TERM GOAL #3   Title Patient and wife will report that patient is regularly trying to stand up from lift chair instead of getting +2 support at home    Time 4    Period Weeks    Status On-going    Target Date 11/10/20                 Plan - 10/13/20 1452    Clinical Impression Statement Demo improved ability to negotiate turns with decreased instances of freezing and festinating using visual cues on ground. Pt advised to seek MD care for assessment of his toe wound. Continued POC indicated to improve functional mobility and increase strength/activity tolerance to reduce level of assistance from caregiver. Skilled services indicated to facilitate motor and postural control to reduce risk for falls    Personal Factors and Comorbidities Comorbidity 1    Comorbidities HTN, CVA, parkinson's    Examination-Activity Limitations Bed Mobility;Stand;Locomotion Level    Stability/Clinical Decision Making Evolving/Moderate complexity    Rehab Potential Fair    PT Frequency 3x / week    PT Duration 6 weeks    PT Treatment/Interventions ADLs/Self Care Home Management;Aquatic Therapy;Balance training;Therapeutic exercise;Therapeutic activities;Functional mobility training;Stair training;Gait training;Neuromuscular re-education;Patient/family education;Manual techniques;Dry needling;Passive range of motion    PT Next Visit Plan mat table LE PRE, dots on floor for stepping strategy to perform turns    PT Home Exercise Plan standing at counter, STS from lift chair; 2/4: seated X to V big UE movements. 2/14  over head stretches; 2/18: bridge and bed mobility (log rolling) 2/21 bridge, SLR, LTR    Consulted and Agree with Plan of Care Patient;Family member/caregiver    Family Member Consulted Rod Holler - Wife           Patient will benefit from skilled therapeutic intervention in order to improve the following deficits and impairments:  Pain,Abnormal gait,Decreased endurance,Increased  edema,Decreased knowledge of precautions,Decreased knowledge of use of DME,Decreased activity tolerance,Decreased balance,Decreased mobility,Difficulty walking,Decreased strength,Decreased range of motion,Postural dysfunction  Visit Diagnosis: Difficulty in walking, not elsewhere classified  Balance problem  History of falling     Problem List There are no problems to display for this patient.  2:58 PM, 10/13/20 M. Sherlyn Lees, PT, DPT Physical Therapist- [place] Office Number: [phone]  [place] [address] [place], [place], [address] Phone: [phone]   Fax:  [phone]  Name: Antonio Park MRN: [id number] Date of Birth: 02-24-1936

## 2020-10-15 ENCOUNTER — Ambulatory Visit (HOSPITAL_COMMUNITY): Payer: Medicare Other

## 2020-10-15 ENCOUNTER — Encounter (HOSPITAL_COMMUNITY): Payer: Self-pay

## 2020-10-15 ENCOUNTER — Other Ambulatory Visit: Payer: Self-pay

## 2020-10-15 DIAGNOSIS — R262 Difficulty in walking, not elsewhere classified: Secondary | ICD-10-CM | POA: Diagnosis not present

## 2020-10-15 DIAGNOSIS — Z9181 History of falling: Secondary | ICD-10-CM

## 2020-10-15 DIAGNOSIS — R2689 Other abnormalities of gait and mobility: Secondary | ICD-10-CM

## 2020-10-15 NOTE — Therapy (Signed)
[place], [place], [address] Phone: [phone]   Fax:  [phone]  Physical Therapy Treatment  Patient Details  Name: Rama Mcclintock MRN: [id number] Date of Birth: [birth year] Referring Provider (PT): Ralph Leyden MD   Encounter Date: 10/15/2020   PT End of Session - 10/15/20 1357    Visit Number 19    Number of Visits 30    Date for PT Re-Evaluation 11/10/20    Authorization Type BCBS no VL or auth. Recert on 10/28/30    Progress Note Due on Visit 28    PT Start Time 1342    PT Stop Time 1430    PT Time Calculation (min) 48 min    Equipment Utilized During Treatment Gait belt    Activity Tolerance Patient tolerated treatment well    Behavior During Therapy WFL for tasks assessed/performed           Past Medical History:  Diagnosis Date  . Cancer [facility])    prostate  . Enlarged prostate   . Hypertension   . Stroke [facility])    TIA    History reviewed. No pertinent surgical history.  There were no vitals filed for this visit.   Subjective Assessment - 10/15/20 1356    Subjective Pt reports he went to [facility] yesterday for evaluation of his 2nd toe wound and has started abx and wound care at this time. Hurts to walk due to toe pain    Patient is accompained by: Family member    Pertinent History parkinson's              [facility] PT Assessment - 10/15/20 0001      Assessment   Medical Diagnosis parkinsonism    Referring Provider (PT) Ralph Leyden MD                         [facility] Adult PT Treatment/Exercise - 10/15/20 0001      Ambulation/Gait   Ambulation/Gait Yes    Ambulation/Gait Assistance 5: Supervision    Ambulation Distance (Feet) 150 Feet    Gait Comments gait training with cues for big steps and clearing obstacles and curb negotiation. Instances of festinating with tactile cues to advance limb with good carryover following cue. decreassed tolerance today due to presence  of right toe sore      Knee/Hip Exercises: Seated   Long Arc Quad Strengthening;Both;3 sets;10 reps    Long Arc Quad Weight 10 lbs.    Long [employer] Limitations visual cues for    Other Seated Knee/Hip Exercises overhead medicine ball raise 3x10 with stability ball behind for thoracic extension 3kg ball. Trunk twists 3x10 with 3 kg ball      Knee/Hip Exercises: Supine   Short Arc Quad Sets Strengthening;Both;3 sets;10 reps    Short Arc Quad Sets Limitations 10    Other Supine Knee/Hip Exercises ball squeeze, hip/knee flexion-extension for speed, ankle pumps against ball rapid/alternating                    PT Short Term Goals - 10/13/20 1456      PT SHORT TERM GOAL #1   Title Patient will report at least 25% improvement in overall symptoms and/or function to demonstrate improved functional mobility    Baseline 10%    Time 2    Period Weeks    Status On-going    Target Date 10/27/20  PT SHORT TERM GOAL #2   Title Patient will be able to stand at counter with one upper extremity support and SBA to demonstrate improved standing balance    Baseline CGA-mod A depending on level of activity    Time 2    Period Weeks    Status On-going    Target Date 10/27/20      PT SHORT TERM GOAL #3   Title Patient and caregiver will be independent in self management strategies to improve quality of life and functional outcomes.    Time 2    Period Weeks    Status Achieved    Target Date 10/27/20             PT Long Term Goals - 10/13/20 1457      PT LONG TERM GOAL #1   Title Patient will be able to transition from sit to stand with verbal cues and min to moderate assist to demosntrate improved transitional mobility    Baseline CGA-mod A    Time 4    Period Weeks    Status Achieved    Target Date 11/10/20      PT LONG TERM GOAL #2   Title Patient will be able to stand with min assist without upper extremity support to demonstrate improved standing balance    Time 4     Period Weeks    Status On-going    Target Date 11/10/20      PT LONG TERM GOAL #3   Title Patient and wife will report that patient is regularly trying to stand up from lift chair instead of getting +2 support at home    Time 4    Period Weeks    Status On-going    Target Date 11/10/20                 Plan - 10/15/20 1531    Clinical Impression Statement Tolertaed strengthening exercises well but requiring frequent visual or tactile cue for speed and/or speed for movement. Good tolerance for increased weight/resistance for ther ex. Continued POC indicated to improve strength and coordination to facilitate improve gait, transfers, and balance    Personal Factors and Comorbidities Comorbidity 1    Comorbidities HTN, CVA, parkinson's    Examination-Activity Limitations Bed Mobility;Stand;Locomotion Level    Stability/Clinical Decision Making Evolving/Moderate complexity    Rehab Potential Fair    PT Frequency 3x / week    PT Duration 6 weeks    PT Treatment/Interventions ADLs/Self Care Home Management;Aquatic Therapy;Balance training;Therapeutic exercise;Therapeutic activities;Functional mobility training;Stair training;Gait training;Neuromuscular re-education;Patient/family education;Manual techniques;Dry needling;Passive range of motion    PT Next Visit Plan mat table LE PRE, dots on floor for stepping strategy to perform turns    PT Home Exercise Plan standing at counter, STS from lift chair; 2/4: seated X to V big UE movements. 2/14  over head stretches; 2/18: bridge and bed mobility (log rolling) 2/21 bridge, SLR, LTR    Consulted and Agree with Plan of Care Patient;Family member/caregiver    Family Member Consulted Rod Holler - Wife           Patient will benefit from skilled therapeutic intervention in order to improve the following deficits and impairments:  Pain,Abnormal gait,Decreased endurance,Increased edema,Decreased knowledge of precautions,Decreased knowledge of use of  DME,Decreased activity tolerance,Decreased balance,Decreased mobility,Difficulty walking,Decreased strength,Decreased range of motion,Postural dysfunction  Visit Diagnosis: Difficulty in walking, not elsewhere classified  Balance problem  History of falling     Problem List There are  no problems to display for this patient.  3:33 PM, 10/15/20 M. Sherlyn Lees, PT, DPT Physical Therapist- [place] Office Number: [phone]  [place] [address] [place], [place], [address] Phone: [phone]   Fax:  [phone]  Name: Fay Swider MRN: [id number] Date of Birth: July 25, 1935

## 2020-10-17 ENCOUNTER — Ambulatory Visit (HOSPITAL_COMMUNITY): Payer: Medicare Other

## 2020-10-17 ENCOUNTER — Other Ambulatory Visit: Payer: Self-pay

## 2020-10-17 DIAGNOSIS — R2689 Other abnormalities of gait and mobility: Secondary | ICD-10-CM

## 2020-10-17 DIAGNOSIS — R262 Difficulty in walking, not elsewhere classified: Secondary | ICD-10-CM | POA: Diagnosis not present

## 2020-10-17 DIAGNOSIS — Z9181 History of falling: Secondary | ICD-10-CM

## 2020-10-17 NOTE — Therapy (Signed)
[place], [place], [address] Phone: [phone]   Fax:  [phone]  Physical Therapy Treatment  Patient Details  Name: Antonio Park MRN: [id number] Date of Birth: 1935-07-29 Referring Provider (PT): Ralph Leyden MD   Encounter Date: 10/17/2020   PT End of Session - 10/17/20 1355    Visit Number 20    Number of Visits 30    Date for PT Re-Evaluation 11/10/20    Authorization Type BCBS no VL or auth. Recert on 2/99/37    Progress Note Due on Visit 28    PT Start Time 1345    PT Stop Time 1430    PT Time Calculation (min) 45 min    Equipment Utilized During Treatment Gait belt    Activity Tolerance Patient tolerated treatment well    Behavior During Therapy WFL for tasks assessed/performed           Past Medical History:  Diagnosis Date  . Cancer [facility])    prostate  . Enlarged prostate   . Hypertension   . Stroke [facility])    TIA    No past surgical history on file.  There were no vitals filed for this visit.   Subjective Assessment - 10/17/20 1355    Subjective Toe is feeling a little better but it still hurts to walk. Started abx tx and has been changing dressing daily    Patient is accompained by: Family member    Pertinent History parkinson's              [facility] PT Assessment - 10/17/20 0001      Assessment   Medical Diagnosis parkinsonism    Referring Provider (PT) Ralph Leyden MD                         [facility] Adult PT Treatment/Exercise - 10/17/20 0001      Ambulation/Gait   Ambulation/Gait Yes    Ambulation/Gait Assistance 5: Supervision    Ambulation/Gait Assistance Details cues for increasing LLE step height and visual cues to reduce freezing of gait    Ambulation Distance (Feet) 150 Feet      Exercises   Exercises Lumbar      Lumbar Exercises: Stretches   Passive Hamstring Stretch Both;2 reps;60 seconds      Lumbar Exercises: Seated   Other Seated  Lumbar Exercises seated single arm row 5 plates 3x10 left/right      Knee/Hip Exercises: Aerobic   Nustep level 5 x 5 min for dynamic warmup and benefit of alternating movement      Knee/Hip Exercises: Seated   Long Arc Quad Strengthening;Both;3 sets;10 reps                    PT Short Term Goals - 10/13/20 1456      PT SHORT TERM GOAL #1   Title Patient will report at least 25% improvement in overall symptoms and/or function to demonstrate improved functional mobility    Baseline 10%    Time 2    Period Weeks    Status On-going    Target Date 10/27/20      PT SHORT TERM GOAL #2   Title Patient will be able to stand at counter with one upper extremity support and SBA to demonstrate improved standing balance    Baseline CGA-mod A depending on level of activity    Time 2    Period Weeks  Status On-going    Target Date 10/27/20      PT SHORT TERM GOAL #3   Title Patient and caregiver will be independent in self management strategies to improve quality of life and functional outcomes.    Time 2    Period Weeks    Status Achieved    Target Date 10/27/20             PT Long Term Goals - 10/13/20 1457      PT LONG TERM GOAL #1   Title Patient will be able to transition from sit to stand with verbal cues and min to moderate assist to demosntrate improved transitional mobility    Baseline CGA-mod A    Time 4    Period Weeks    Status Achieved    Target Date 11/10/20      PT LONG TERM GOAL #2   Title Patient will be able to stand with min assist without upper extremity support to demonstrate improved standing balance    Time 4    Period Weeks    Status On-going    Target Date 11/10/20      PT LONG TERM GOAL #3   Title Patient and wife will report that patient is regularly trying to stand up from lift chair instead of getting +2 support at home    Time 4    Period Weeks    Status On-going    Target Date 11/10/20                 Plan - 10/17/20  1540    Clinical Impression Statement Tolerating tx sessions well and doing well with increased resistance with strengthening regimen. LLE demo greater weakness than RLE with knee extension. Pt reports he has been having an easier time at home transferring from his recliner requiring one assist to complete. Continued POC indicated to maintain CLOF and progress strengtheining to improve functional activity tolerance    Personal Factors and Comorbidities Comorbidity 1    Comorbidities HTN, CVA, parkinson's    Examination-Activity Limitations Bed Mobility;Stand;Locomotion Level    Stability/Clinical Decision Making Evolving/Moderate complexity    Rehab Potential Fair    PT Frequency 3x / week    PT Duration 6 weeks    PT Treatment/Interventions ADLs/Self Care Home Management;Aquatic Therapy;Balance training;Therapeutic exercise;Therapeutic activities;Functional mobility training;Stair training;Gait training;Neuromuscular re-education;Patient/family education;Manual techniques;Dry needling;Passive range of motion    PT Next Visit Plan mat table LE PRE, dots on floor for stepping strategy to perform turns    PT Home Exercise Plan standing at counter, STS from lift chair; 2/4: seated X to V big UE movements. 2/14  over head stretches; 2/18: bridge and bed mobility (log rolling) 2/21 bridge, SLR, LTR    Consulted and Agree with Plan of Care Patient;Family member/caregiver    Family Member Consulted Rod Holler - Wife           Patient will benefit from skilled therapeutic intervention in order to improve the following deficits and impairments:  Pain,Abnormal gait,Decreased endurance,Increased edema,Decreased knowledge of precautions,Decreased knowledge of use of DME,Decreased activity tolerance,Decreased balance,Decreased mobility,Difficulty walking,Decreased strength,Decreased range of motion,Postural dysfunction  Visit Diagnosis: Difficulty in walking, not elsewhere classified  Balance problem  History  of falling     Problem List There are no problems to display for this patient.  3:42 PM, 10/17/20 M. Sherlyn Lees, PT, DPT Physical Therapist- [place] Office Number: [phone]  [place], [place],  [place] Phone: [phone]   Fax:  [phone]  Name: Antonio Park MRN: [id number] Date of Birth: May 12, 1936

## 2020-10-20 ENCOUNTER — Other Ambulatory Visit: Payer: Self-pay

## 2020-10-20 ENCOUNTER — Encounter (HOSPITAL_COMMUNITY): Payer: Self-pay

## 2020-10-20 ENCOUNTER — Ambulatory Visit (HOSPITAL_COMMUNITY): Payer: Medicare Other | Attending: Internal Medicine

## 2020-10-20 DIAGNOSIS — R262 Difficulty in walking, not elsewhere classified: Secondary | ICD-10-CM | POA: Insufficient documentation

## 2020-10-20 DIAGNOSIS — R2689 Other abnormalities of gait and mobility: Secondary | ICD-10-CM | POA: Diagnosis present

## 2020-10-20 DIAGNOSIS — Z9181 History of falling: Secondary | ICD-10-CM | POA: Diagnosis present

## 2020-10-20 NOTE — Therapy (Signed)
[place], [place], [address] Phone: [phone]   Fax:  [phone]  Physical Therapy Treatment  Patient Details  Name: Antonio Park MRN: [id number] Date of Birth: 04/22/1936 Referring Provider (PT): Ralph Leyden MD   Encounter Date: 10/20/2020   PT End of Session - 10/20/20 1321    Visit Number 21    Number of Visits 30    Date for PT Re-Evaluation 11/10/20    Authorization Type BCBS no VL or auth. Recert on 7/78/24    Progress Note Due on Visit 28    PT Start Time 1315   late arrival   PT Stop Time 1345    PT Time Calculation (min) 30 min    Equipment Utilized During Treatment Gait belt    Activity Tolerance Patient tolerated treatment well    Behavior During Therapy WFL for tasks assessed/performed           Past Medical History:  Diagnosis Date  . Cancer [facility])    prostate  . Enlarged prostate   . Hypertension   . Stroke [facility])    TIA    History reviewed. No pertinent surgical history.  There were no vitals filed for this visit.   Subjective Assessment - 10/20/20 1320    Subjective Toe is feeling better still taking ABX    Patient is accompained by: Family member    Pertinent History parkinson's                             [facility] Adult PT Treatment/Exercise - 10/20/20 0001      Ambulation/Gait   Ambulation/Gait Yes    Ambulation/Gait Assistance 4: Min assist    Ambulation/Gait Assistance Details gait training with use of colored dots on ground perofrming trials of forward/backwrad walking with therapist as AD to increase step length performed over 8 ft increments with targets placed apporx 9" apart and emphasis on trunk extension during forward and hip extension cues for retro-walking. Lateral stepping along arc of colored dots to improve foot clearance and decrease instances of freezing of gait during turns. Lateral stepping along 6 colored dots to improve single limb  loading response. Continued with gait training using RW level surfaces x 50 ft increments with cues for heel strike initial contact                  PT Education - 10/20/20 1417    Education Details education in tx rationale for use of visual targets for increasing step length/height requiring frequent cues in typical environment.    Person(s) Educated Patient;Spouse    Methods Explanation;Demonstration    Comprehension Verbalized understanding;Returned demonstration;Need further instruction            PT Short Term Goals - 10/13/20 1456      PT SHORT TERM GOAL #1   Title Patient will report at least 25% improvement in overall symptoms and/or function to demonstrate improved functional mobility    Baseline 10%    Time 2    Period Weeks    Status On-going    Target Date 10/27/20      PT SHORT TERM GOAL #2   Title Patient will be able to stand at counter with one upper extremity support and SBA to demonstrate improved standing balance    Baseline CGA-mod A depending on level of activity    Time 2    Period Weeks  Status On-going    Target Date 10/27/20      PT SHORT TERM GOAL #3   Title Patient and caregiver will be independent in self management strategies to improve quality of life and functional outcomes.    Time 2    Period Weeks    Status Achieved    Target Date 10/27/20             PT Long Term Goals - 10/13/20 1457      PT LONG TERM GOAL #1   Title Patient will be able to transition from sit to stand with verbal cues and min to moderate assist to demosntrate improved transitional mobility    Baseline CGA-mod A    Time 4    Period Weeks    Status Achieved    Target Date 11/10/20      PT LONG TERM GOAL #2   Title Patient will be able to stand with min assist without upper extremity support to demonstrate improved standing balance    Time 4    Period Weeks    Status On-going    Target Date 11/10/20      PT LONG TERM GOAL #3   Title Patient and  wife will report that patient is regularly trying to stand up from lift chair instead of getting +2 support at home    Time 4    Period Weeks    Status On-going    Target Date 11/10/20                 Plan - 10/20/20 1418    Clinical Impression Statement Patient tolerated tx session very well and demonstrate much improved step length with use of visual targets on the ground and able to demo normal step length with consistent cues and use of therapist to aid in weight shifting. Increased difficulty with performing retro walking with difficulty in initiating hip extension and righting reactions requiring frequent input from therapist for postural adjustment and weight shift to avoid LOB.  Continued PT services indicated to aid and facilitate in motor control and righting reactions to maintain CLOF and prevent functional decline    Personal Factors and Comorbidities Comorbidity 1    Comorbidities HTN, CVA, parkinson's    Examination-Activity Limitations Bed Mobility;Stand;Locomotion Level    Stability/Clinical Decision Making Evolving/Moderate complexity    Rehab Potential Fair    PT Frequency 3x / week    PT Duration 6 weeks    PT Treatment/Interventions ADLs/Self Care Home Management;Aquatic Therapy;Balance training;Therapeutic exercise;Therapeutic activities;Functional mobility training;Stair training;Gait training;Neuromuscular re-education;Patient/family education;Manual techniques;Dry needling;Passive range of motion    PT Next Visit Plan mat table LE PRE, dots on floor for stepping strategy to perform turns    PT Home Exercise Plan standing at counter, STS from lift chair; 2/4: seated X to V big UE movements. 2/14  over head stretches; 2/18: bridge and bed mobility (log rolling) 2/21 bridge, SLR, LTR    Consulted and Agree with Plan of Care Patient;Family member/caregiver    Family Member Consulted Rod Holler - Wife           Patient will benefit from skilled therapeutic intervention  in order to improve the following deficits and impairments:  Pain,Abnormal gait,Decreased endurance,Increased edema,Decreased knowledge of precautions,Decreased knowledge of use of DME,Decreased activity tolerance,Decreased balance,Decreased mobility,Difficulty walking,Decreased strength,Decreased range of motion,Postural dysfunction  Visit Diagnosis: Difficulty in walking, not elsewhere classified  Balance problem  History of falling     Problem List There are no problems  to display for this patient.  2:27 PM, 10/20/20 M. Sherlyn Lees, PT, DPT Physical Therapist- [place] Office Number: [phone]  [place] [address] [place], [place], [address] Phone: [phone]   Fax:  [phone]  Name: Dionicio Shelnutt MRN: [id number] Date of Birth: June 23, 1935

## 2020-10-22 ENCOUNTER — Encounter (HOSPITAL_COMMUNITY): Payer: Self-pay

## 2020-10-22 ENCOUNTER — Ambulatory Visit (HOSPITAL_COMMUNITY): Payer: Medicare Other

## 2020-10-22 ENCOUNTER — Other Ambulatory Visit: Payer: Self-pay

## 2020-10-22 DIAGNOSIS — Z9181 History of falling: Secondary | ICD-10-CM

## 2020-10-22 DIAGNOSIS — R2689 Other abnormalities of gait and mobility: Secondary | ICD-10-CM

## 2020-10-22 DIAGNOSIS — R262 Difficulty in walking, not elsewhere classified: Secondary | ICD-10-CM | POA: Diagnosis not present

## 2020-10-22 NOTE — Therapy (Signed)
[place], [place], [address] Phone: [phone]   Fax:  [phone]  Physical Therapy Treatment  Patient Details  Name: Antonio Park MRN: [id number] Date of Birth: 24-Feb-1936 Referring Provider (PT): Ralph Leyden MD   Encounter Date: 10/22/2020   PT End of Session - 10/22/20 1321    Visit Number 22    Number of Visits 30    Date for PT Re-Evaluation 11/10/20    Authorization Type BCBS no VL or auth. Recert on 0/34/91    Progress Note Due on Visit 28    PT Start Time 1315   late arrival   PT Stop Time 1345    PT Time Calculation (min) 30 min    Equipment Utilized During Treatment Gait belt    Activity Tolerance Patient tolerated treatment well    Behavior During Therapy WFL for tasks assessed/performed           Past Medical History:  Diagnosis Date  . Cancer [facility])    prostate  . Enlarged prostate   . Hypertension   . Stroke [facility])    TIA    History reviewed. No pertinent surgical history.  There were no vitals filed for this visit.   Subjective Assessment - 10/22/20 1319    Subjective Patient reports he has been feeling better and his toe is starting to heal and is less sore to walk    Patient is accompained by: Family member    Pertinent History parkinson's    Currently in Pain? No/denies    Pain Score 0-No pain              [facility] PT Assessment - 10/22/20 0001      Assessment   Medical Diagnosis parkinsonism    Referring Provider (PT) Ralph Leyden MD                         [facility] Adult PT Treatment/Exercise - 10/22/20 0001      Ambulation/Gait   Ambulation/Gait Yes    Ambulation/Gait Assistance 4: Min assist    Ambulation/Gait Assistance Details gait training with use of weight belt and CGA-min A to induce postural perturbations to facilitate righting reactions      Lumbar Exercises: Machines for Strengthening   Leg Press against manual resistance 3x20 reps       Lumbar Exercises: Seated   Other Seated Lumbar Exercises seated rows 4 plates 3x15                    PT Short Term Goals - 10/13/20 1456      PT SHORT TERM GOAL #1   Title Patient will report at least 25% improvement in overall symptoms and/or function to demonstrate improved functional mobility    Baseline 10%    Time 2    Period Weeks    Status On-going    Target Date 10/27/20      PT SHORT TERM GOAL #2   Title Patient will be able to stand at counter with one upper extremity support and SBA to demonstrate improved standing balance    Baseline CGA-mod A depending on level of activity    Time 2    Period Weeks    Status On-going    Target Date 10/27/20      PT SHORT TERM GOAL #3   Title Patient and caregiver will be independent in self management strategies to improve quality of life  and functional outcomes.    Time 2    Period Weeks    Status Achieved    Target Date 10/27/20             PT Long Term Goals - 10/13/20 1457      PT LONG TERM GOAL #1   Title Patient will be able to transition from sit to stand with verbal cues and min to moderate assist to demosntrate improved transitional mobility    Baseline CGA-mod A    Time 4    Period Weeks    Status Achieved    Target Date 11/10/20      PT LONG TERM GOAL #2   Title Patient will be able to stand with min assist without upper extremity support to demonstrate improved standing balance    Time 4    Period Weeks    Status On-going    Target Date 11/10/20      PT LONG TERM GOAL #3   Title Patient and wife will report that patient is regularly trying to stand up from lift chair instead of getting +2 support at home    Time 4    Period Weeks    Status On-going    Target Date 11/10/20                 Plan - 10/22/20 1438    Clinical Impression Statement Good performance today with decrease in freezing of gait and improved fluidity when negotiating turns only requiring 25% tactile/verbal  cues. Poor ability to initiate stepping strategy with retro-LOB 100% of trials with decreased ability to initiate/advance hip extension to aid.  Improve ankle strategy appreciated against mild-moderate perturbation Continued POC indicated to improve motor control and facilitate postural re-education to reduce risk for falls during mobility and advance PRE to increase functional strength and neuromuscular facilitation    Personal Factors and Comorbidities Comorbidity 1    Comorbidities HTN, CVA, parkinson's    Examination-Activity Limitations Bed Mobility;Stand;Locomotion Level    Stability/Clinical Decision Making Evolving/Moderate complexity    Rehab Potential Fair    PT Frequency 3x / week    PT Duration 6 weeks    PT Treatment/Interventions ADLs/Self Care Home Management;Aquatic Therapy;Balance training;Therapeutic exercise;Therapeutic activities;Functional mobility training;Stair training;Gait training;Neuromuscular re-education;Patient/family education;Manual techniques;Dry needling;Passive range of motion    PT Next Visit Plan mat table LE PRE, dots on floor for stepping strategy to perform turns    PT Home Exercise Plan side stepping with wall railing at home    Consulted and Agree with Plan of Care Patient;Family member/caregiver    Family Member Consulted Rod Holler - Wife           Patient will benefit from skilled therapeutic intervention in order to improve the following deficits and impairments:  Pain,Abnormal gait,Decreased endurance,Increased edema,Decreased knowledge of precautions,Decreased knowledge of use of DME,Decreased activity tolerance,Decreased balance,Decreased mobility,Difficulty walking,Decreased strength,Decreased range of motion,Postural dysfunction  Visit Diagnosis: Difficulty in walking, not elsewhere classified  Balance problem  History of falling     Problem List There are no problems to display for this patient.  2:42 PM, 10/22/20 M. Sherlyn Lees, PT,  DPT Physical Therapist- [place] Office Number: [phone]  [place] [address] [place], [place], [address] Phone: [phone]   Fax:  [phone]  Name: Antonio Park MRN: [id number] Date of Birth: Mar 09, 1936

## 2020-10-24 ENCOUNTER — Other Ambulatory Visit: Payer: Self-pay

## 2020-10-24 ENCOUNTER — Ambulatory Visit (HOSPITAL_COMMUNITY): Payer: Medicare Other | Admitting: Physical Therapy

## 2020-10-24 DIAGNOSIS — R262 Difficulty in walking, not elsewhere classified: Secondary | ICD-10-CM | POA: Diagnosis not present

## 2020-10-24 DIAGNOSIS — Z9181 History of falling: Secondary | ICD-10-CM

## 2020-10-24 DIAGNOSIS — R2689 Other abnormalities of gait and mobility: Secondary | ICD-10-CM

## 2020-10-24 NOTE — Therapy (Signed)
[place], [place], [address] Phone: [phone]   Fax:  [phone]  Physical Therapy Treatment  Patient Details  Name: Antonio Park MRN: [id number] Date of Birth: 16-Jan-1936 Referring Provider (PT): Ralph Leyden MD   Encounter Date: 10/24/2020   PT End of Session - 10/24/20 1538    Visit Number 23    Number of Visits 30    Date for PT Re-Evaluation 11/10/20    Authorization Type BCBS no VL or auth. Recert on 1/44/81    Progress Note Due on Visit 28    PT Start Time 1540    PT Stop Time 1618    PT Time Calculation (min) 38 min    Equipment Utilized During Treatment Gait belt    Activity Tolerance Patient tolerated treatment well    Behavior During Therapy WFL for tasks assessed/performed           Past Medical History:  Diagnosis Date  . Cancer [facility])    prostate  . Enlarged prostate   . Hypertension   . Stroke [facility])    TIA    No past surgical history on file.  There were no vitals filed for this visit.   Subjective Assessment - 10/24/20 1537    Subjective Pt states he is doing well    Patient is accompained by: Family member    Pertinent History parkinson's    Currently in Pain? No/denies                             Gt x 50 ft with walker working on increasing step length, clearing toe.       Balance Exercises - 10/24/20 0001      Balance Exercises: Standing   Tandem Stance Eyes open;2 reps   semitandem   Sidestepping 2 reps    Cone Rotation R/L;Limitations    Cone Rotation Limitations sitting    Marching Solid surface;10 reps    Heel Raises 10 reps   sitting   Toe Raise 10 reps    Other Standing Exercises shifting wt side to side; anterior post    Other Standing Exercises Comments sitting rotation x 10,pulling into good posture x 10               PT Short Term Goals - 10/13/20 1456      PT SHORT TERM GOAL #1   Title Patient will report at least  25% improvement in overall symptoms and/or function to demonstrate improved functional mobility    Baseline 10%    Time 2    Period Weeks    Status On-going    Target Date 10/27/20      PT SHORT TERM GOAL #2   Title Patient will be able to stand at counter with one upper extremity support and SBA to demonstrate improved standing balance    Baseline CGA-mod A depending on level of activity    Time 2    Period Weeks    Status On-going    Target Date 10/27/20      PT SHORT TERM GOAL #3   Title Patient and caregiver will be independent in self management strategies to improve quality of life and functional outcomes.    Time 2    Period Weeks    Status Achieved    Target Date 10/27/20             PT Long Term  Goals - 10/13/20 1457      PT LONG TERM GOAL #1   Title Patient will be able to transition from sit to stand with verbal cues and min to moderate assist to demosntrate improved transitional mobility    Baseline CGA-mod A    Time 4    Period Weeks    Status Achieved    Target Date 11/10/20      PT LONG TERM GOAL #2   Title Patient will be able to stand with min assist without upper extremity support to demonstrate improved standing balance    Time 4    Period Weeks    Status On-going    Target Date 11/10/20      PT LONG TERM GOAL #3   Title Patient and wife will report that patient is regularly trying to stand up from lift chair instead of getting +2 support at home    Time 4    Period Weeks    Status On-going    Target Date 11/10/20                 Plan - 10/24/20 1539    Personal Factors and Comorbidities Comorbidity 1    Comorbidities HTN, CVA, parkinson's    Examination-Activity Limitations Bed Mobility;Stand;Locomotion Level    Stability/Clinical Decision Making Evolving/Moderate complexity    Rehab Potential Fair    PT Frequency 3x / week    PT Duration 6 weeks    PT Treatment/Interventions ADLs/Self Care Home Management;Aquatic Therapy;Balance  training;Therapeutic exercise;Therapeutic activities;Functional mobility training;Stair training;Gait training;Neuromuscular re-education;Patient/family education;Manual techniques;Dry needling;Passive range of motion    PT Next Visit Plan mat table LE PRE, dots on floor for stepping strategy to perform turns    PT Home Exercise Plan side stepping with wall railing at home    Consulted and Agree with Plan of Care Patient;Family member/caregiver    Family Member Consulted Rod Holler - Wife           Patient will benefit from skilled therapeutic intervention in order to improve the following deficits and impairments:  Pain,Abnormal gait,Decreased endurance,Increased edema,Decreased knowledge of precautions,Decreased knowledge of use of DME,Decreased activity tolerance,Decreased balance,Decreased mobility,Difficulty walking,Decreased strength,Decreased range of motion,Postural dysfunction  Visit Diagnosis: Difficulty in walking, not elsewhere classified  Balance problem  History of falling     Problem List There are no problems to display for this patient.   Antonio Park, PT CLT [phone] 10/24/2020, 4:20 PM  [facility] [address] [place], [place], [address] Phone: [phone]   Fax:  [phone]  Name: Antonio Park MRN: [id number] Date of Birth: Jan 04, 1936

## 2020-10-27 ENCOUNTER — Ambulatory Visit (HOSPITAL_COMMUNITY): Payer: Medicare Other | Admitting: Physical Therapy

## 2020-10-27 ENCOUNTER — Other Ambulatory Visit: Payer: Self-pay

## 2020-10-27 ENCOUNTER — Encounter (HOSPITAL_COMMUNITY): Payer: Self-pay | Admitting: Physical Therapy

## 2020-10-27 DIAGNOSIS — R2689 Other abnormalities of gait and mobility: Secondary | ICD-10-CM

## 2020-10-27 DIAGNOSIS — R262 Difficulty in walking, not elsewhere classified: Secondary | ICD-10-CM

## 2020-10-27 DIAGNOSIS — Z9181 History of falling: Secondary | ICD-10-CM

## 2020-10-27 NOTE — Therapy (Signed)
[place], [place], [address] Phone: [phone]   Fax:  [phone]  Physical Therapy Treatment  Patient Details  Name: Antonio Park MRN: [id number] Date of Birth: Mar 13, 1936 Referring Provider (PT): Ralph Leyden MD   Encounter Date: 10/27/2020   PT End of Session - 10/27/20 1403    Visit Number 24    Number of Visits 30    Date for PT Re-Evaluation 11/10/20    Authorization Type BCBS no VL or auth. Recert on 3/53/29    Progress Note Due on Visit 28    PT Start Time 1403    PT Stop Time 1443    PT Time Calculation (min) 40 min    Equipment Utilized During Treatment Gait belt    Activity Tolerance Patient tolerated treatment well    Behavior During Therapy WFL for tasks assessed/performed           Past Medical History:  Diagnosis Date  . Cancer [facility])    prostate  . Enlarged prostate   . Hypertension   . Stroke [facility])    TIA    History reviewed. No pertinent surgical history.  There were no vitals filed for this visit.   Subjective Assessment - 10/27/20 1404    Subjective Patient states he is alright.    Patient is accompained by: Family member    Pertinent History parkinson's    Currently in Pain? No/denies                             [facility] Adult PT Treatment/Exercise - 10/27/20 0001      Lumbar Exercises: Standing   Other Standing Lumbar Exercises standing at wall with dynodisc in lumbar region, add bicep curls x 20 bilateral, shoulder flexion x 20 bilateral      Lumbar Exercises: Seated   Other Seated Lumbar Exercises seated rows 4 plates 3x15      Lumbar Exercises: Supine   Other Supine Lumbar Exercises LTR 10x 5 second holds bilateral, marches 1x 10 bilateral, hip flexion with overpressure 5 x 20 seconds bilateral                    PT Short Term Goals - 10/13/20 1456      PT SHORT TERM GOAL #1   Title Patient will report at least 25% improvement  in overall symptoms and/or function to demonstrate improved functional mobility    Baseline 10%    Time 2    Period Weeks    Status On-going    Target Date 10/27/20      PT SHORT TERM GOAL #2   Title Patient will be able to stand at counter with one upper extremity support and SBA to demonstrate improved standing balance    Baseline CGA-mod A depending on level of activity    Time 2    Period Weeks    Status On-going    Target Date 10/27/20      PT SHORT TERM GOAL #3   Title Patient and caregiver will be independent in self management strategies to improve quality of life and functional outcomes.    Time 2    Period Weeks    Status Achieved    Target Date 10/27/20             PT Long Term Goals - 10/13/20 1457      PT LONG TERM GOAL #1  Title Patient will be able to transition from sit to stand with verbal cues and min to moderate assist to demosntrate improved transitional mobility    Baseline CGA-mod A    Time 4    Period Weeks    Status Achieved    Target Date 11/10/20      PT LONG TERM GOAL #2   Title Patient will be able to stand with min assist without upper extremity support to demonstrate improved standing balance    Time 4    Period Weeks    Status On-going    Target Date 11/10/20      PT LONG TERM GOAL #3   Title Patient and wife will report that patient is regularly trying to stand up from lift chair instead of getting +2 support at home    Time 4    Period Weeks    Status On-going    Target Date 11/10/20                 Plan - 10/27/20 1404    Clinical Impression Statement Continued with established POC with postural and posterior chain strengthening. Patient with c/o low back pain with standing at wall while completing UE exercises. He fatigues quickly with standing today and requests to complete mat exercises. Patient feels improvement in low back symptoms following hip flexion with overpressure. Patient assisted to vehicle at end of  session. Patient will continue to benefit from skilled physical therapy in order to reduce impairment and improve function.    Personal Factors and Comorbidities Comorbidity 1    Comorbidities HTN, CVA, parkinson's    Examination-Activity Limitations Bed Mobility;Stand;Locomotion Level    Stability/Clinical Decision Making Evolving/Moderate complexity    Rehab Potential Fair    PT Frequency 3x / week    PT Duration 6 weeks    PT Treatment/Interventions ADLs/Self Care Home Management;Aquatic Therapy;Balance training;Therapeutic exercise;Therapeutic activities;Functional mobility training;Stair training;Gait training;Neuromuscular re-education;Patient/family education;Manual techniques;Dry needling;Passive range of motion    PT Next Visit Plan mat table LE PRE, dots on floor for stepping strategy to perform turns    PT Home Exercise Plan side stepping with wall railing at home    Consulted and Agree with Plan of Care Patient;Family member/caregiver    Family Member Consulted Rod Holler - Wife           Patient will benefit from skilled therapeutic intervention in order to improve the following deficits and impairments:  Pain,Abnormal gait,Decreased endurance,Increased edema,Decreased knowledge of precautions,Decreased knowledge of use of DME,Decreased activity tolerance,Decreased balance,Decreased mobility,Difficulty walking,Decreased strength,Decreased range of motion,Postural dysfunction  Visit Diagnosis: Difficulty in walking, not elsewhere classified  Balance problem  History of falling     Problem List There are no problems to display for this patient.  2:47 PM, 10/27/20 Mearl Latin PT, DPT Physical Therapist at [facility] [place], [place], [address] Phone: [phone]   Fax:  [phone]  Name: Antonio Park MRN: [id number] Date of Birth: 1936-01-22

## 2020-11-05 ENCOUNTER — Ambulatory Visit (HOSPITAL_COMMUNITY): Payer: Medicare Other

## 2020-11-05 ENCOUNTER — Encounter (HOSPITAL_COMMUNITY): Payer: Self-pay

## 2020-11-05 ENCOUNTER — Other Ambulatory Visit: Payer: Self-pay

## 2020-11-05 DIAGNOSIS — R2689 Other abnormalities of gait and mobility: Secondary | ICD-10-CM

## 2020-11-05 DIAGNOSIS — R262 Difficulty in walking, not elsewhere classified: Secondary | ICD-10-CM

## 2020-11-05 DIAGNOSIS — Z9181 History of falling: Secondary | ICD-10-CM

## 2020-11-05 NOTE — Therapy (Signed)
[place], [place], [address] Phone: [phone]   Fax:  [phone]  Physical Therapy Treatment  Patient Details  Name: Antonio Park MRN: [id number] Date of Birth: [birth year] Referring Provider (PT): Ralph Leyden MD   Encounter Date: 11/05/2020   PT End of Session - 11/05/20 1353    Visit Number 25    Number of Visits 30    Date for PT Re-Evaluation 11/10/20    Authorization Type BCBS no VL or auth. Recert on 2/54/98    Progress Note Due on Visit 28    PT Start Time 1345    PT Stop Time 1430    PT Time Calculation (min) 45 min    Equipment Utilized During Treatment Gait belt    Activity Tolerance Patient tolerated treatment well    Behavior During Therapy WFL for tasks assessed/performed           Past Medical History:  Diagnosis Date  . Cancer [facility])    prostate  . Enlarged prostate   . Hypertension   . Stroke [facility])    TIA    History reviewed. No pertinent surgical history.  There were no vitals filed for this visit.   Subjective Assessment - 11/05/20 1353    Subjective Reports his toe is feeling better and he's been using crutches for walking at home    Patient is accompained by: Family member    Pertinent History parkinson's                             [facility] Adult PT Treatment/Exercise - 11/05/20 0001      Bed Mobility   Bed Mobility Supine to Sit;Sit to Supine    Supine to Sit Maximal Assistance - Patient - Patient 25-49%    Sit to Supine Maximal Assistance - Patient 25-49%      Transfers   Transfers Sit to Stand;Stand Pivot Transfers    Sit to Stand 4: Min guard;3: Mod assist    Sit to Stand Details Tactile cues for sequencing;Tactile cues for weight shifting;Verbal cues for sequencing    Comments car transfer with mod to max assist with legs. Improved sit to stand at end of session requiring tactile cues and verbal cues to "jump" when initiating sit to stand       Ambulation/Gait   Ambulation/Gait Yes    Ambulation/Gait Assistance 5: Supervision    Ambulation/Gait Assistance Details gait training with RW and verbal cues for increased step height. Curb negotiation with RW and SBA-CGA    Gait Comments cues to minimize freezing of gait during turns and to minimize festination with improved carryover providing verbal/tactile cues      Lumbar Exercises: Stretches   Passive Hamstring Stretch 3 reps;60 seconds;Right;Left    Single Knee to Chest Stretch Right;Left;3 reps;20 seconds      Lumbar Exercises: Aerobic   Nustep level 5 x 6 min for dynamic warm-up and coordination      Knee/Hip Exercises: Standing   Hip Flexion Stengthening;Both;2 sets;10 reps    Hip Flexion Limitations 3 lbs with 6" and then 4" box with colored targets on top for foot position      Knee/Hip Exercises: Seated   Long Arc Quad Strengthening;Both;2 sets;15 reps    Long Arc Quad Weight 3 lbs.                    PT Short Term  Goals - 10/13/20 1456      PT SHORT TERM GOAL #1   Title Patient will report at least 25% improvement in overall symptoms and/or function to demonstrate improved functional mobility    Baseline 10%    Time 2    Period Weeks    Status On-going    Target Date 10/27/20      PT SHORT TERM GOAL #2   Title Patient will be able to stand at counter with one upper extremity support and SBA to demonstrate improved standing balance    Baseline CGA-mod A depending on level of activity    Time 2    Period Weeks    Status On-going    Target Date 10/27/20      PT SHORT TERM GOAL #3   Title Patient and caregiver will be independent in self management strategies to improve quality of life and functional outcomes.    Time 2    Period Weeks    Status Achieved    Target Date 10/27/20             PT Long Term Goals - 10/13/20 1457      PT LONG TERM GOAL #1   Title Patient will be able to transition from sit to stand with verbal cues and min  to moderate assist to demosntrate improved transitional mobility    Baseline CGA-mod A    Time 4    Period Weeks    Status Achieved    Target Date 11/10/20      PT LONG TERM GOAL #2   Title Patient will be able to stand with min assist without upper extremity support to demonstrate improved standing balance    Time 4    Period Weeks    Status On-going    Target Date 11/10/20      PT LONG TERM GOAL #3   Title Patient and wife will report that patient is regularly trying to stand up from lift chair instead of getting +2 support at home    Time 4    Period Weeks    Status On-going    Target Date 11/10/20                 Plan - 11/05/20 1447    Clinical Impression Statement Continues to demonstrate paucity of movement and difficulty with movement initiation with improved performance when tactile/verbal cues initiated.  Demonstrates freezing of gait and instances of festination when transitioning from turns or changing floor surfaces/transitions requiring verbal cues for "big step over". Caregiver reports they have been having an easier time at home performing transfers and reports he has been able to ambulate within the household more efficiently since participating with therapy services.  Continued PT services indicated to improve strength, motor control and train in adaptive strategies to reduce burden of care and risk for falls    Personal Factors and Comorbidities Comorbidity 1    Comorbidities HTN, CVA, parkinson's    Examination-Activity Limitations Bed Mobility;Stand;Locomotion Level    Stability/Clinical Decision Making Evolving/Moderate complexity    Rehab Potential Fair    PT Frequency 3x / week    PT Duration 6 weeks    PT Treatment/Interventions ADLs/Self Care Home Management;Aquatic Therapy;Balance training;Therapeutic exercise;Therapeutic activities;Functional mobility training;Stair training;Gait training;Neuromuscular re-education;Patient/family education;Manual  techniques;Dry needling;Passive range of motion    PT Next Visit Plan mat table LE PRE, dots on floor for stepping strategy to perform turns    PT Home Exercise Plan side stepping with  wall railing at home    Consulted and Agree with Plan of Care Patient;Family member/caregiver    Family Member Consulted Rod Holler - Wife           Patient will benefit from skilled therapeutic intervention in order to improve the following deficits and impairments:  Pain,Abnormal gait,Decreased endurance,Increased edema,Decreased knowledge of precautions,Decreased knowledge of use of DME,Decreased activity tolerance,Decreased balance,Decreased mobility,Difficulty walking,Decreased strength,Decreased range of motion,Postural dysfunction  Visit Diagnosis: Difficulty in walking, not elsewhere classified  Balance problem  History of falling     Problem List There are no problems to display for this patient.  2:52 PM, 11/05/20 M. Sherlyn Lees, PT, DPT Physical Therapist- [place] Office Number: [phone]  [facility] [address] [place], [place], [address] Phone: [phone]   Fax:  [phone]  Name: Antonio Park MRN: [id number] Date of Birth: 10-08-1935

## 2020-11-07 ENCOUNTER — Other Ambulatory Visit: Payer: Self-pay

## 2020-11-07 ENCOUNTER — Ambulatory Visit (HOSPITAL_COMMUNITY): Payer: Medicare Other

## 2020-11-07 DIAGNOSIS — Z9181 History of falling: Secondary | ICD-10-CM

## 2020-11-07 DIAGNOSIS — R262 Difficulty in walking, not elsewhere classified: Secondary | ICD-10-CM | POA: Diagnosis not present

## 2020-11-07 DIAGNOSIS — R2689 Other abnormalities of gait and mobility: Secondary | ICD-10-CM

## 2020-11-07 NOTE — Therapy (Signed)
[name], [place], [address] Phone: [phone]   Fax:  [phone]  Physical Therapy Treatment  Patient Details  Name: Antonio Park MRN: [id number] Date of Birth: 29-Aug-1935 Referring Provider (PT): Ralph Leyden MD   Encounter Date: 11/07/2020   PT End of Session - 11/07/20 1502    Visit Number 26    Number of Visits 30    Date for PT Re-Evaluation 11/10/20    Authorization Type BCBS no VL or auth. Recert on 7/56/43    Progress Note Due on Visit 28    PT Start Time 3295    PT Stop Time 1535    PT Time Calculation (min) 58 min    Equipment Utilized During Treatment Gait belt    Activity Tolerance Patient tolerated treatment well    Behavior During Therapy WFL for tasks assessed/performed           Past Medical History:  Diagnosis Date  . Cancer [facility])    prostate  . Enlarged prostate   . Hypertension   . Stroke [facility])    TIA    No past surgical history on file.  There were no vitals filed for this visit.   Subjective Assessment - 11/07/20 1503    Subjective Reports his toe is feeling better and he's been using crutches for walking at home    Patient is accompained by: Family member    Pertinent History parkinson's                             [facility] Adult PT Treatment/Exercise - 11/07/20 0001      Transfers   Transfers Sit to [employer] Transfers    Sit to Stand 4: Min guard;3: Mod assist    Sit to Stand Details Tactile cues for sequencing;Tactile cues for weight shifting;Verbal cues for sequencing    Comments car transfer with mod to max assist with legs. Improved sit to stand at end of session requiring tactile cues and verbal cues to "jump" when initiating sit to stand      Ambulation/Gait   Ambulation/Gait Yes    Ambulation/Gait Assistance 4: Min guard    Ambulation/Gait Assistance Details gait trials performed in parallel bars with 1" hurdles with trials  stepping over into bilateral stance, then rearranged to facilitate step-through gait and requiring turning 180 degrees at end of each trial to reduce freezing of gait    Gait Comments cues to minimize freezing of gait during turns and to minimize festination with improved carryover providing verbal/tactile cues      Posture/Postural Control   Posture/Postural Control Postural limitations    Postural Limitations Rounded Shoulders;Forward head;Flexed trunk    Posture Comments seated static stretching for thoracic extension from therapist with pt perofmring neck extension, rotation Rt/Lt 10 sec each position 4 rounds      Lumbar Exercises: Aerobic   Nustep level 5 x 7 min for dynamic warm-up and coordination                  PT Education - 11/07/20 1546    Education Details pt and his spouse educated on AD with laser to prject line on ground as possible intervention to improve foot clearance and step-through gait pattern.    Person(s) Educated Patient;Spouse    Methods Explanation;Demonstration    Comprehension Verbalized understanding            PT Short  Term Goals - 10/13/20 1456      PT SHORT TERM GOAL #1   Title Patient will report at least 25% improvement in overall symptoms and/or function to demonstrate improved functional mobility    Baseline 10%    Time 2    Period Weeks    Status On-going    Target Date 10/27/20      PT SHORT TERM GOAL #2   Title Patient will be able to stand at counter with one upper extremity support and SBA to demonstrate improved standing balance    Baseline CGA-mod A depending on level of activity    Time 2    Period Weeks    Status On-going    Target Date 10/27/20      PT SHORT TERM GOAL #3   Title Patient and caregiver will be independent in self management strategies to improve quality of life and functional outcomes.    Time 2    Period Weeks    Status Achieved    Target Date 10/27/20             PT Long Term Goals -  10/13/20 1457      PT LONG TERM GOAL #1   Title Patient will be able to transition from sit to stand with verbal cues and min to moderate assist to demosntrate improved transitional mobility    Baseline CGA-mod A    Time 4    Period Weeks    Status Achieved    Target Date 11/10/20      PT LONG TERM GOAL #2   Title Patient will be able to stand with min assist without upper extremity support to demonstrate improved standing balance    Time 4    Period Weeks    Status On-going    Target Date 11/10/20      PT LONG TERM GOAL #3   Title Patient and wife will report that patient is regularly trying to stand up from lift chair instead of getting +2 support at home    Time 4    Period Weeks    Status On-going    Target Date 11/10/20                 Plan - 11/07/20 1547    Clinical Impression Statement Pt demonstrates markedly improved gait pattern and step height/through when he has a visual target to orient his LE.  Pt tolerating tx sessions well and demonstrates improved gait pattern at end of sessions with increased step height/length and decreased instances of freezing of gait with turns and surface transitions.  Continued PT services indicated to provide intervention to improve functional strength, activity tolerance, coordination, and facilitate neuro-motor control.  Pt and spouse educated on use of walker with laser to facilitate improved gait kinematics    Personal Factors and Comorbidities Comorbidity 1    Comorbidities HTN, CVA, parkinson's    Examination-Activity Limitations Bed Mobility;Stand;Locomotion Level    Stability/Clinical Decision Making Evolving/Moderate complexity    Rehab Potential Fair    PT Frequency 3x / week    PT Duration 6 weeks    PT Treatment/Interventions ADLs/Self Care Home Management;Aquatic Therapy;Balance training;Therapeutic exercise;Therapeutic activities;Functional mobility training;Stair training;Gait training;Neuromuscular  re-education;Patient/family education;Manual techniques;Dry needling;Passive range of motion    PT Next Visit Plan mat table LE PRE, dots on floor for stepping strategy to perform turns    PT Home Exercise Plan side stepping with wall railing at home    Consulted and Agree with  Plan of Care Patient;Family member/caregiver    Family Member Consulted Rod Holler - Wife           Patient will benefit from skilled therapeutic intervention in order to improve the following deficits and impairments:  Pain,Abnormal gait,Decreased endurance,Increased edema,Decreased knowledge of precautions,Decreased knowledge of use of DME,Decreased activity tolerance,Decreased balance,Decreased mobility,Difficulty walking,Decreased strength,Decreased range of motion,Postural dysfunction  Visit Diagnosis: Difficulty in walking, not elsewhere classified  Balance problem  History of falling     Problem List There are no problems to display for this patient.  3:51 PM, 11/07/20 M. Sherlyn Lees, PT, DPT Physical Therapist- [place] Office Number: [phone]  [place] [address] [place], [place], [address] Phone: [phone]   Fax:  [phone]  Name: Antonio Park MRN: [id number] Date of Birth: 1936-04-10

## 2020-11-10 ENCOUNTER — Ambulatory Visit (HOSPITAL_COMMUNITY): Payer: Medicare Other | Admitting: Physical Therapy

## 2020-11-10 ENCOUNTER — Other Ambulatory Visit: Payer: Self-pay

## 2020-11-10 ENCOUNTER — Encounter (HOSPITAL_COMMUNITY): Payer: Self-pay | Admitting: Physical Therapy

## 2020-11-10 DIAGNOSIS — R262 Difficulty in walking, not elsewhere classified: Secondary | ICD-10-CM | POA: Diagnosis not present

## 2020-11-10 DIAGNOSIS — Z9181 History of falling: Secondary | ICD-10-CM

## 2020-11-10 DIAGNOSIS — R2689 Other abnormalities of gait and mobility: Secondary | ICD-10-CM

## 2020-11-10 NOTE — Therapy (Signed)
[place] [address] [place], [place], [address] Phone: [phone]   Fax:  [phone]  Physical Therapy Treatment/ Progress Note/Recert  Patient Details  Name: Antonio Park MRN: [id number] Date of Birth: 1936-06-15 Referring Provider (PT): Ralph Leyden MD   Encounter Date: 11/10/2020   Progress Note   Reporting Period 10/13/20 to 11/10/20   See note below for Objective Data and Assessment of Progress/Goals    PT End of Session - 11/10/20 1533    Visit Number 27    Number of Visits 38    Date for PT Re-Evaluation 11/10/20    Authorization Type Primary Medicare, Secondary BCBS no VL or auth (add KX modifier)    Progress Note Due on Visit 49    PT Start Time 1532    PT Stop Time 1619    PT Time Calculation (min) 47 min    Equipment Utilized During Treatment Gait belt    Activity Tolerance Patient tolerated treatment well    Behavior During Therapy [facility] for tasks assessed/performed           Past Medical History:  Diagnosis Date  . Cancer [facility])    prostate  . Enlarged prostate   . Hypertension   . Stroke [facility])    TIA    History reviewed. No pertinent surgical history.  There were no vitals filed for this visit.   Subjective Assessment - 11/10/20 1534    Subjective Patient states 35-40% improvement with PT intervention. Patient wants to continue therapy. He has not fallen. He went to MD today. He has been able to work on exercises at home. He has been able to work on using his bars on the walls at home. He can stand for about 1 minute at home while brushing his teeth. Toe is sore. Patient wants to be able to transfer with more independence.    Patient is accompained by: Family member    Pertinent History parkinson's    Currently in Pain? No/denies              [facility] PT Assessment - 11/10/20 0001      Assessment   Medical Diagnosis parkinsonism    Referring Provider (PT) Ralph Leyden MD    Next MD  Visit went today      Precautions   Precautions Fall      Restrictions   Weight Bearing Restrictions No      Balance Screen   Has the patient fallen in the past 6 months No    Has the patient had a decrease in activity level because of a fear of falling?  No    Is the patient reluctant to leave their home because of a fear of falling?  No      Home Environment   Living Arrangements Spouse/significant other      Prior Function   Level of Independence Needs assistance with ADLs;Needs assistance with gait;Needs assistance with transfers;Needs assistance with homemaking      Cognition   Overall Cognitive Status Within Functional Limits for tasks assessed      Observation/Other Assessments   Observations Ambulates with rollator, slouched in seated    Focus on Therapeutic Outcomes (FOTO)  NA      Transfers   Sit to Stand 4: Min guard;3: Mod assist    Sit to Stand Details Tactile cues for sequencing;Tactile cues for weight shifting;Verbal cues for sequencing  PT Education - 11/10/20 1534    Education Details HEP, exercise mechanics, reassessment findings, POC, Parkinson's symptoms    Person(s) Educated Patient;Spouse;Other (comment)   grandson   Methods Explanation;Demonstration    Comprehension Verbalized understanding;Returned demonstration            PT Short Term Goals - 11/10/20 1547      PT SHORT TERM GOAL #1   Title Patient will report at least 25% improvement in overall symptoms and/or function to demonstrate improved functional mobility    Baseline 35-40%    Time 2    Period Weeks    Status Achieved    Target Date 10/27/20      PT SHORT TERM GOAL #2   Title Patient will be able to stand at counter with one upper extremity support and SBA to demonstrate improved standing balance    Baseline SBA at counter to brush teeth    Time 2    Period Weeks    Status Achieved    Target Date 10/27/20      PT SHORT  TERM GOAL #3   Title Patient and caregiver will be independent in self management strategies to improve quality of life and functional outcomes.    Time 2    Period Weeks    Status Achieved    Target Date 10/27/20             PT Long Term Goals - 11/10/20 1555      PT LONG TERM GOAL #1   Title Patient will be able to transition from sit to stand with verbal cues and min to moderate assist to demosntrate improved transitional mobility    Baseline CGA-mod A    Time 4    Period Weeks    Status Achieved      PT LONG TERM GOAL #2   Title Patient will be able to stand with min assist without upper extremity support to demonstrate improved standing balance    Time 4    Period Weeks    Status Achieved      PT LONG TERM GOAL #3   Title Patient and wife will report that patient is regularly trying to stand up from lift chair instead of getting +2 support at home    Time 4    Period Weeks    Status On-going      PT LONG TERM GOAL #4   Title Patient will be able to transfer from sit to stand with supervision/min G for improved ability to trasfer to standing at home.    Time 4    Period Weeks    Status New    Target Date 12/08/20                 Plan - 11/10/20 1534    Clinical Impression Statement Patient has met 3/3 short term goals and 2/3 long term goals with ability to complete HEP and improving standing, transfers, and balance. New goal added for improved transfer ability secondary to continued difficulty. Educated patient, Rod Holler, and grandson on symptoms of Parkinson's and realistic expectations. Patient adamant about improving transfer ability and discussed POC. Adding new goal to assist with transfers. Remaining goals not met due to continued difficulty with transfers. Patient demonstrating improving gait ability. He continues to require frequent verbal cueing with increased time for reaction. Patient will continue to benefit from skilled physical therapy in order to  reduce impairment and improve function.    Personal Factors and Comorbidities Comorbidity  1    Comorbidities HTN, CVA, parkinson's    Examination-Activity Limitations Bed Mobility;Stand;Locomotion Level    Stability/Clinical Decision Making Evolving/Moderate complexity    Rehab Potential Fair    PT Frequency 2x / week    PT Duration 4 weeks    PT Treatment/Interventions ADLs/Self Care Home Management;Aquatic Therapy;Balance training;Therapeutic exercise;Therapeutic activities;Functional mobility training;Stair training;Gait training;Neuromuscular re-education;Patient/family education;Manual techniques;Dry needling;Passive range of motion    PT Next Visit Plan transfers, posture    PT Home Exercise Plan side stepping with wall railing at home    Consulted and Agree with Plan of Care Patient;Family member/caregiver    Family Member Consulted Rod Holler - Wife           Patient will benefit from skilled therapeutic intervention in order to improve the following deficits and impairments:  Pain,Abnormal gait,Decreased endurance,Increased edema,Decreased knowledge of precautions,Decreased knowledge of use of DME,Decreased activity tolerance,Decreased balance,Decreased mobility,Difficulty walking,Decreased strength,Decreased range of motion,Postural dysfunction  Visit Diagnosis: Difficulty in walking, not elsewhere classified  Balance problem  History of falling     Problem List There are no problems to display for this patient.   4:42 PM, 11/10/20 Mearl Latin PT, DPT Physical Therapist at [place], [place], [address] Phone: [phone]   Fax:  [phone]  Name: Antonio Park MRN: [id number] Date of Birth: June 27, 1935

## 2020-11-11 ENCOUNTER — Ambulatory Visit (HOSPITAL_COMMUNITY): Payer: BLUE CROSS/BLUE SHIELD

## 2020-11-14 ENCOUNTER — Ambulatory Visit (HOSPITAL_COMMUNITY): Payer: Medicare Other

## 2021-01-08 ENCOUNTER — Encounter (HOSPITAL_COMMUNITY): Payer: Self-pay | Admitting: Physical Therapy

## 2021-01-08 NOTE — Therapy (Signed)
[place], [place], [address] Phone: [phone]   Fax:  [phone]  Patient Details  Name: Antonio Park MRN: [id number] Date of Birth: August 17, 1935 Referring Provider:  No ref. provider found  Encounter Date: 01/08/2021   PHYSICAL THERAPY DISCHARGE SUMMARY  Visits from Start of Care: 27  Current functional level related to goals / functional outcomes: See last PT notes.   Remaining deficits: See last PT notes.   Education / Equipment: See last PT notes.   Patient agrees to discharge. Patient goals were partially met. Patient is being discharged due to not returning since the last visit.  3:13 PM, 01/08/21 Mearl Latin PT, DPT Physical Therapist at [place], [place], [address] Phone: [phone]   Fax:  [phone]
